# Patient Record
Sex: Female | Born: 1935 | Race: White | Hispanic: No | Smoking: Never smoker
Health system: Southern US, Community
[De-identification: ages and names within clinical notes are randomized; demographics above are authoritative.]

## PROBLEM LIST (undated history)

## (undated) DIAGNOSIS — M40209 Unspecified kyphosis, site unspecified: Secondary | ICD-10-CM

## (undated) DIAGNOSIS — E039 Hypothyroidism, unspecified: Secondary | ICD-10-CM

## (undated) DIAGNOSIS — F0390 Unspecified dementia without behavioral disturbance: Secondary | ICD-10-CM

## (undated) DIAGNOSIS — M419 Scoliosis, unspecified: Secondary | ICD-10-CM

## (undated) DIAGNOSIS — E785 Hyperlipidemia, unspecified: Secondary | ICD-10-CM

## (undated) DIAGNOSIS — I10 Essential (primary) hypertension: Secondary | ICD-10-CM

---
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
## 2018-08-11 ENCOUNTER — Encounter (HOSPITAL_COMMUNITY): Payer: Self-pay | Admitting: Emergency Medicine

## 2018-08-11 ENCOUNTER — Emergency Department (HOSPITAL_COMMUNITY)
Admission: EM | Admit: 2018-08-11 | Discharge: 2018-08-11 | Disposition: A | Discharge status: Home / Self Care | Payer: Medicare Other | Attending: Emergency Medicine | Admitting: Emergency Medicine

## 2018-08-11 ENCOUNTER — Emergency Department (HOSPITAL_COMMUNITY): Payer: Medicare Other

## 2018-08-11 ENCOUNTER — Other Ambulatory Visit: Payer: Self-pay

## 2018-08-11 DIAGNOSIS — Y92192 Bathroom in other specified residential institution as the place of occurrence of the external cause: Secondary | ICD-10-CM | POA: Diagnosis not present

## 2018-08-11 DIAGNOSIS — Y999 Unspecified external cause status: Secondary | ICD-10-CM | POA: Insufficient documentation

## 2018-08-11 DIAGNOSIS — W01198A Fall on same level from slipping, tripping and stumbling with subsequent striking against other object, initial encounter: Secondary | ICD-10-CM | POA: Diagnosis not present

## 2018-08-11 DIAGNOSIS — F0390 Unspecified dementia without behavioral disturbance: Secondary | ICD-10-CM | POA: Diagnosis not present

## 2018-08-11 DIAGNOSIS — Y939 Activity, unspecified: Secondary | ICD-10-CM | POA: Insufficient documentation

## 2018-08-11 DIAGNOSIS — W010XXA Fall on same level from slipping, tripping and stumbling without subsequent striking against object, initial encounter: Secondary | ICD-10-CM

## 2018-08-11 DIAGNOSIS — S0990XA Unspecified injury of head, initial encounter: Secondary | ICD-10-CM | POA: Diagnosis present

## 2018-08-11 DIAGNOSIS — S2231XA Fracture of one rib, right side, initial encounter for closed fracture: Secondary | ICD-10-CM | POA: Insufficient documentation

## 2018-08-11 LAB — CBC WITH DIFFERENTIAL/PLATELET
Abs Immature Granulocytes: 0.03 10*3/uL (ref 0.00–0.07)
Basophils Absolute: 0.1 10*3/uL (ref 0.0–0.1)
Basophils Relative: 1 %
Eosinophils Absolute: 0 10*3/uL (ref 0.0–0.5)
Eosinophils Relative: 0 %
HCT: 39.8 % (ref 36.0–46.0)
Hemoglobin: 13.4 g/dL (ref 12.0–15.0)
Immature Granulocytes: 0 %
Lymphocytes Relative: 11 %
Lymphs Abs: 1.1 10*3/uL (ref 0.7–4.0)
MCH: 30.4 pg (ref 26.0–34.0)
MCHC: 33.7 g/dL (ref 30.0–36.0)
MCV: 90.2 fL (ref 80.0–100.0)
Monocytes Absolute: 0.8 10*3/uL (ref 0.1–1.0)
Monocytes Relative: 8 %
Neutro Abs: 8.2 10*3/uL — ABNORMAL HIGH (ref 1.7–7.7)
Neutrophils Relative %: 80 %
Platelets: 311 10*3/uL (ref 150–400)
RBC: 4.41 MIL/uL (ref 3.87–5.11)
RDW: 13.2 % (ref 11.5–15.5)
WBC: 10.1 10*3/uL (ref 4.0–10.5)
nRBC: 0 % (ref 0.0–0.2)

## 2018-08-11 LAB — COMPREHENSIVE METABOLIC PANEL
ALT: 15 U/L (ref 0–44)
AST: 24 U/L (ref 15–41)
Albumin: 3.6 g/dL (ref 3.5–5.0)
Alkaline Phosphatase: 58 U/L (ref 38–126)
Anion gap: 8 (ref 5–15)
BUN: 12 mg/dL (ref 8–23)
CO2: 25 mmol/L (ref 22–32)
Calcium: 9.3 mg/dL (ref 8.9–10.3)
Chloride: 98 mmol/L (ref 98–111)
Creatinine, Ser: 0.73 mg/dL (ref 0.44–1.00)
GFR calc Af Amer: >60 mL/min (ref >60)
GFR calc non Af Amer: >60 mL/min (ref >60)
Glucose, Bld: 117 mg/dL — ABNORMAL HIGH (ref 70–99)
Potassium: 4.3 mmol/L (ref 3.5–5.1)
Sodium: 131 mmol/L — ABNORMAL LOW (ref 135–145)
Total Bilirubin: 1.2 mg/dL (ref 0.3–1.2)
Total Protein: 6.8 g/dL (ref 6.5–8.1)

## 2018-08-11 LAB — URINALYSIS, ROUTINE W REFLEX MICROSCOPIC
Bilirubin Urine: NEGATIVE
Glucose, UA: NEGATIVE mg/dL
Ketones, ur: 20 mg/dL — AB
Leukocytes,Ua: NEGATIVE
Nitrite: NEGATIVE
Protein, ur: NEGATIVE mg/dL
Specific Gravity, Urine: 1.014 (ref 1.005–1.030)
pH: 6 (ref 5.0–8.0)

## 2018-08-11 IMAGING — CT CT HEAD WITHOUT CONTRAST
4 series · 16 of 47 positions shown, 18 images · non-contrast
Comparison: None.

CLINICAL DATA: 88-year-old female status post unwitnessed fall.
Right rib fracture. Struck right side of head.

EXAM:
CT HEAD WITHOUT CONTRAST
TECHNIQUE: Contiguous axial images were obtained from the base of the skull
through the vertex without intravenous contrast.

[Series 3: head without · axial · non-contrast · 0.42mm/px · z∈[-129,-9]mm · 7 of 33 slices shown, 9 images]
[im 5/33  brain]
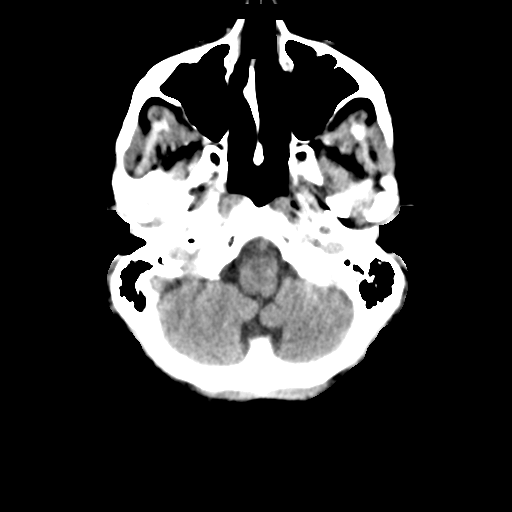
[im 5/33  bone]
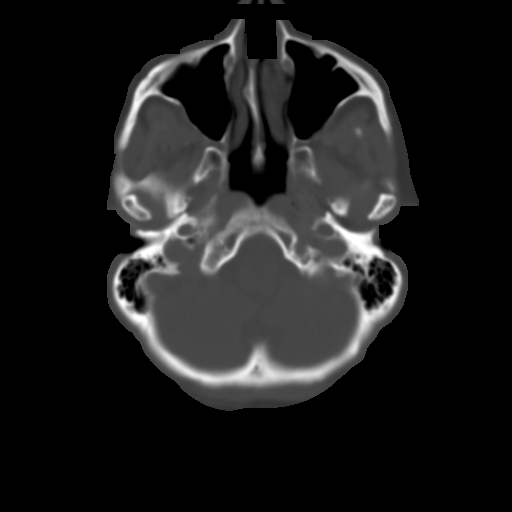
[im 9/33  brain]
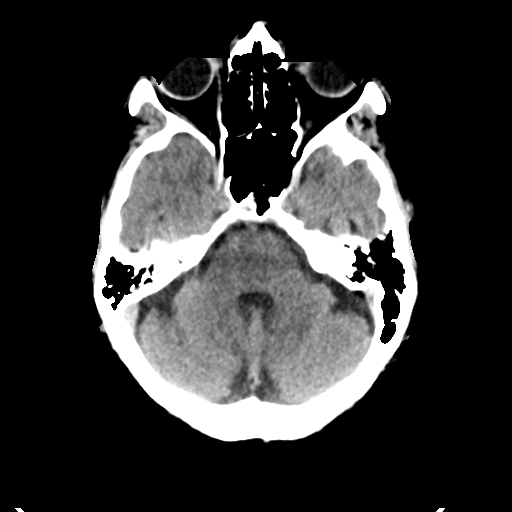
[im 13/33  brain]
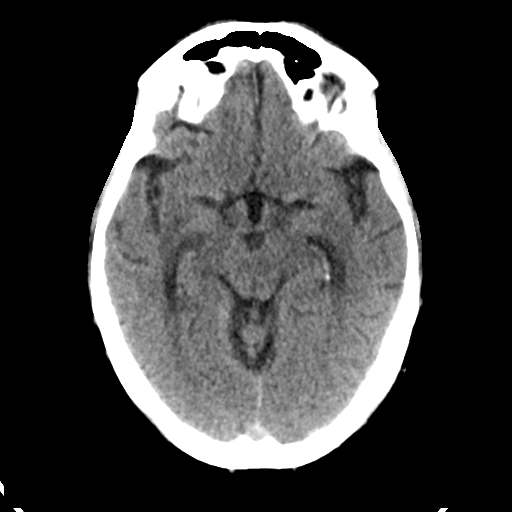
[im 17/33  brain]
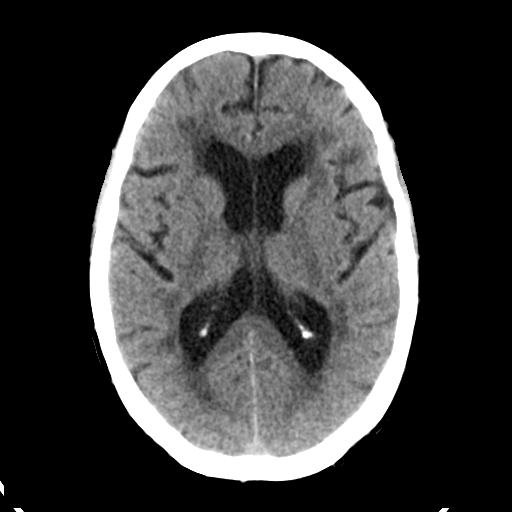
[im 21/33  brain]
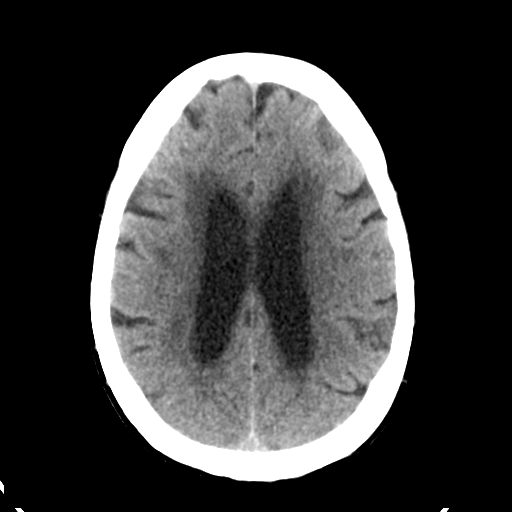
[im 21/33  bone]
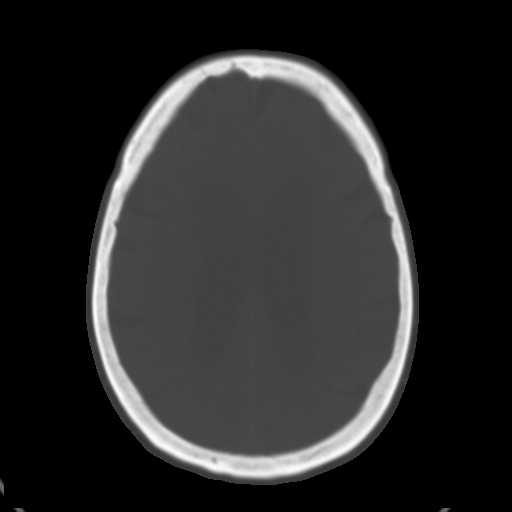
[im 25/33  brain]
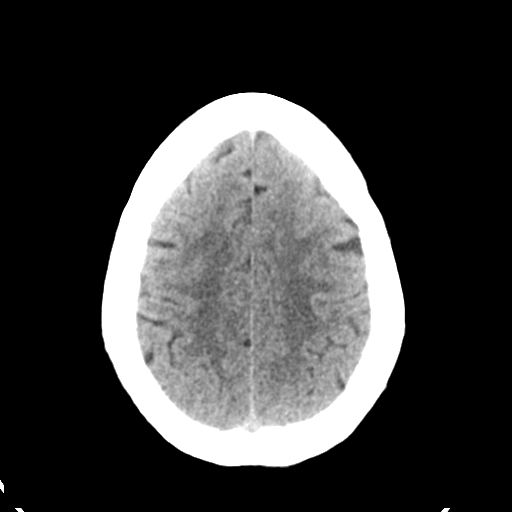
[im 29/33  brain]
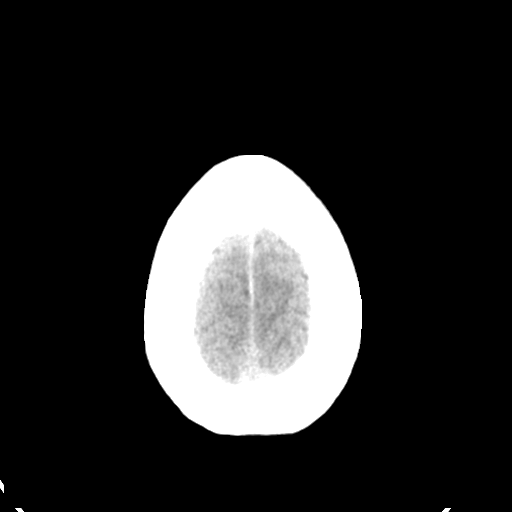

[Series 4: head bone · axial · 0.42mm/px · z∈[-133,-101]mm · 3 of 83 slices shown]
[im 9/83  bone]
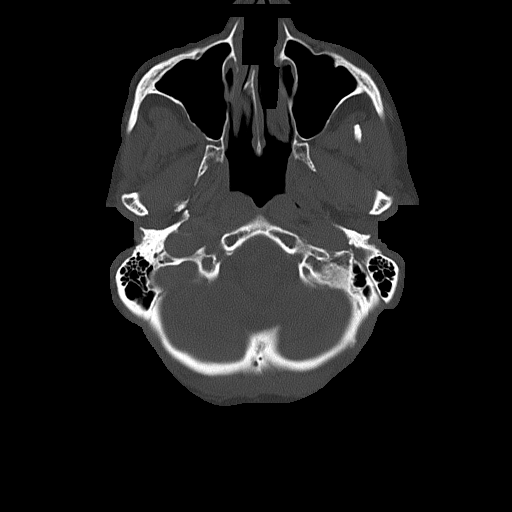
[im 17/83  bone]
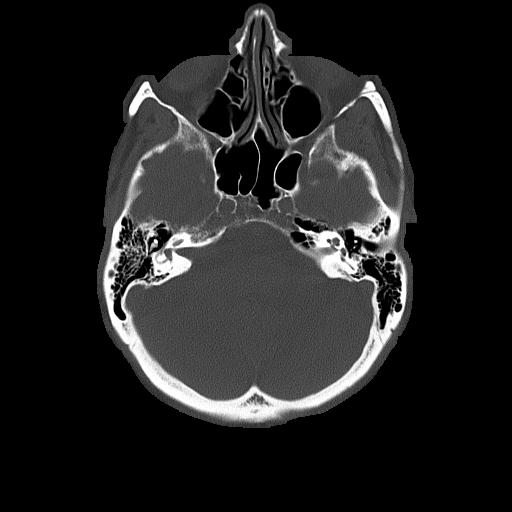
[im 25/83  bone]
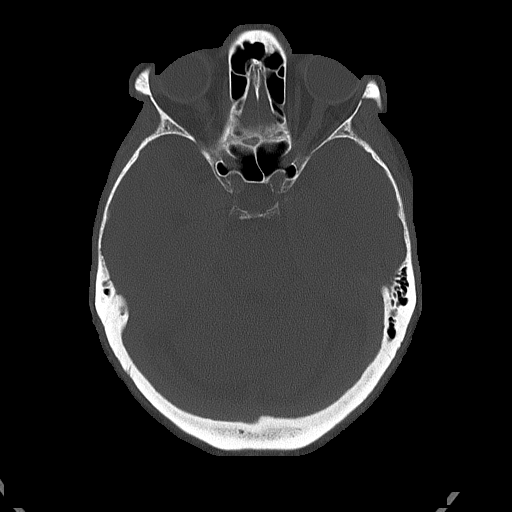

[Series 5: head without cor · coronal · non-contrast · 0.30mm/px · 3 of 66 slices shown]
[im 24/66  brain]
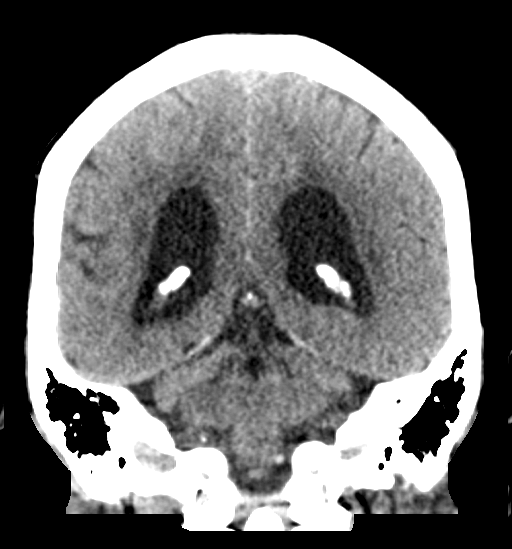
[im 30/66  brain]
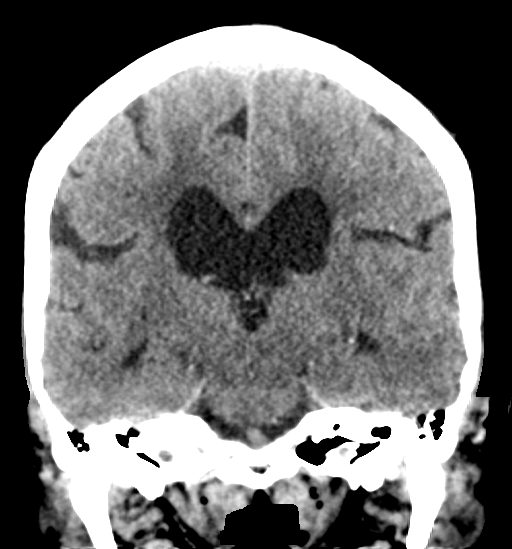
[im 36/66  brain]
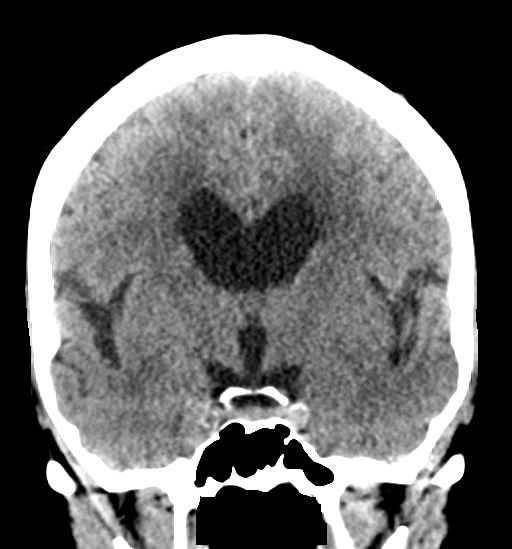

[Series 6: head without sag · sagittal · non-contrast · 0.32mm/px · 3 of 52 slices shown]
[im 18/52  brain]
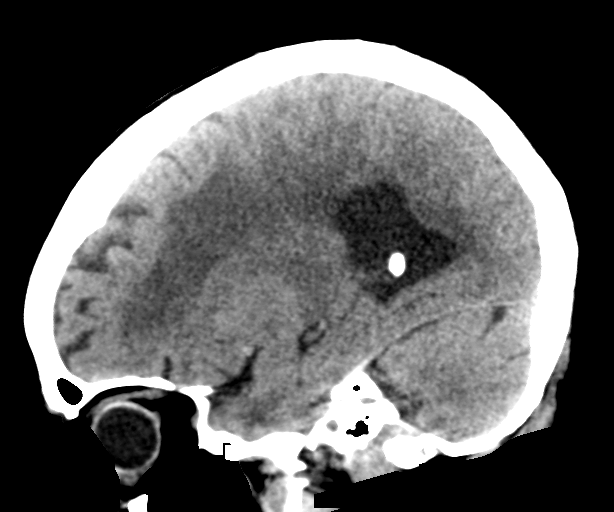
[im 26/52  brain]
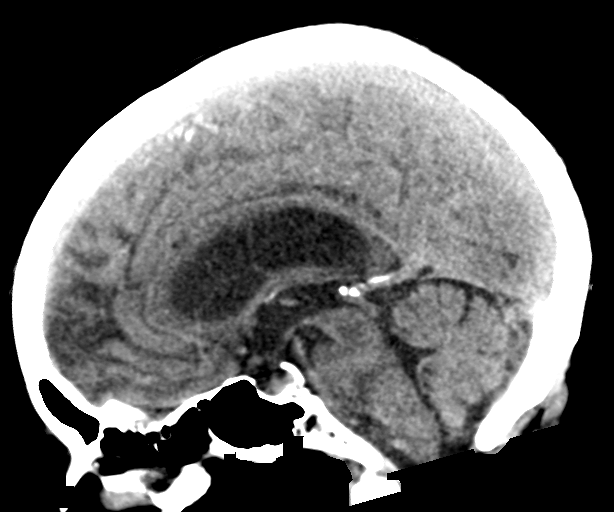
[im 35/52  brain]
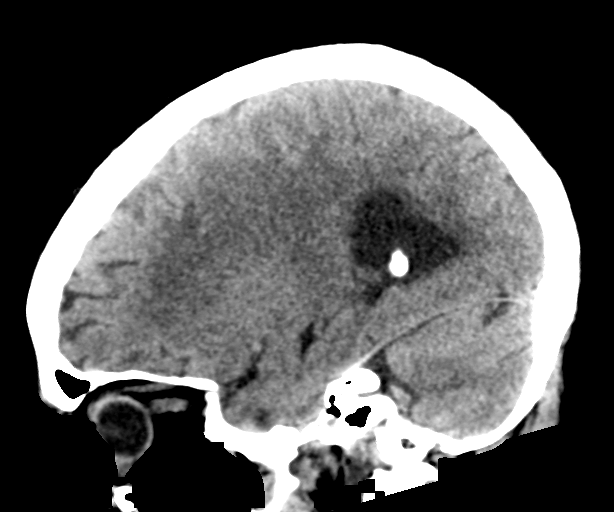

[16 of 47 positions shown; findings below may reference images not displayed]

FINDINGS: Brain: Cerebral volume appears relatively normal for age with
probable ex vacuo ventricular prominence. There is Patchy and
confluent bilateral cerebral white matter hypodensity with deep
white matter capsule involvement.

No midline shift, ventriculomegaly, mass effect, evidence of mass
lesion, intracranial hemorrhage or evidence of cortically based
acute infarction. No cortical encephalomalacia identified.

Vascular: Calcified atherosclerosis at the skull base. No suspicious
intracranial vascular hyperdensity.

Skull: No osseous abnormality identified.

Sinuses/Orbits: Visualized paranasal sinuses and mastoids are stable
and well pneumatized.

Other: No scalp hematoma identified. Negative orbits.
IMPRESSION: 1. No acute traumatic injury identified.
2. No acute intracranial abnormality. Advanced cerebral white matter
disease, most commonly small vessel related.

## 2018-08-11 IMAGING — CR RIGHT RIBS AND CHEST - 3+ VIEW
3 series · 3 of 3 positions shown · non-contrast
Comparison: None.

CLINICAL DATA: 88-year-old female status post fall with right lower
rib pain.

EXAM:
RIGHT RIBS AND CHEST - 3+ VIEW

[chest ap]
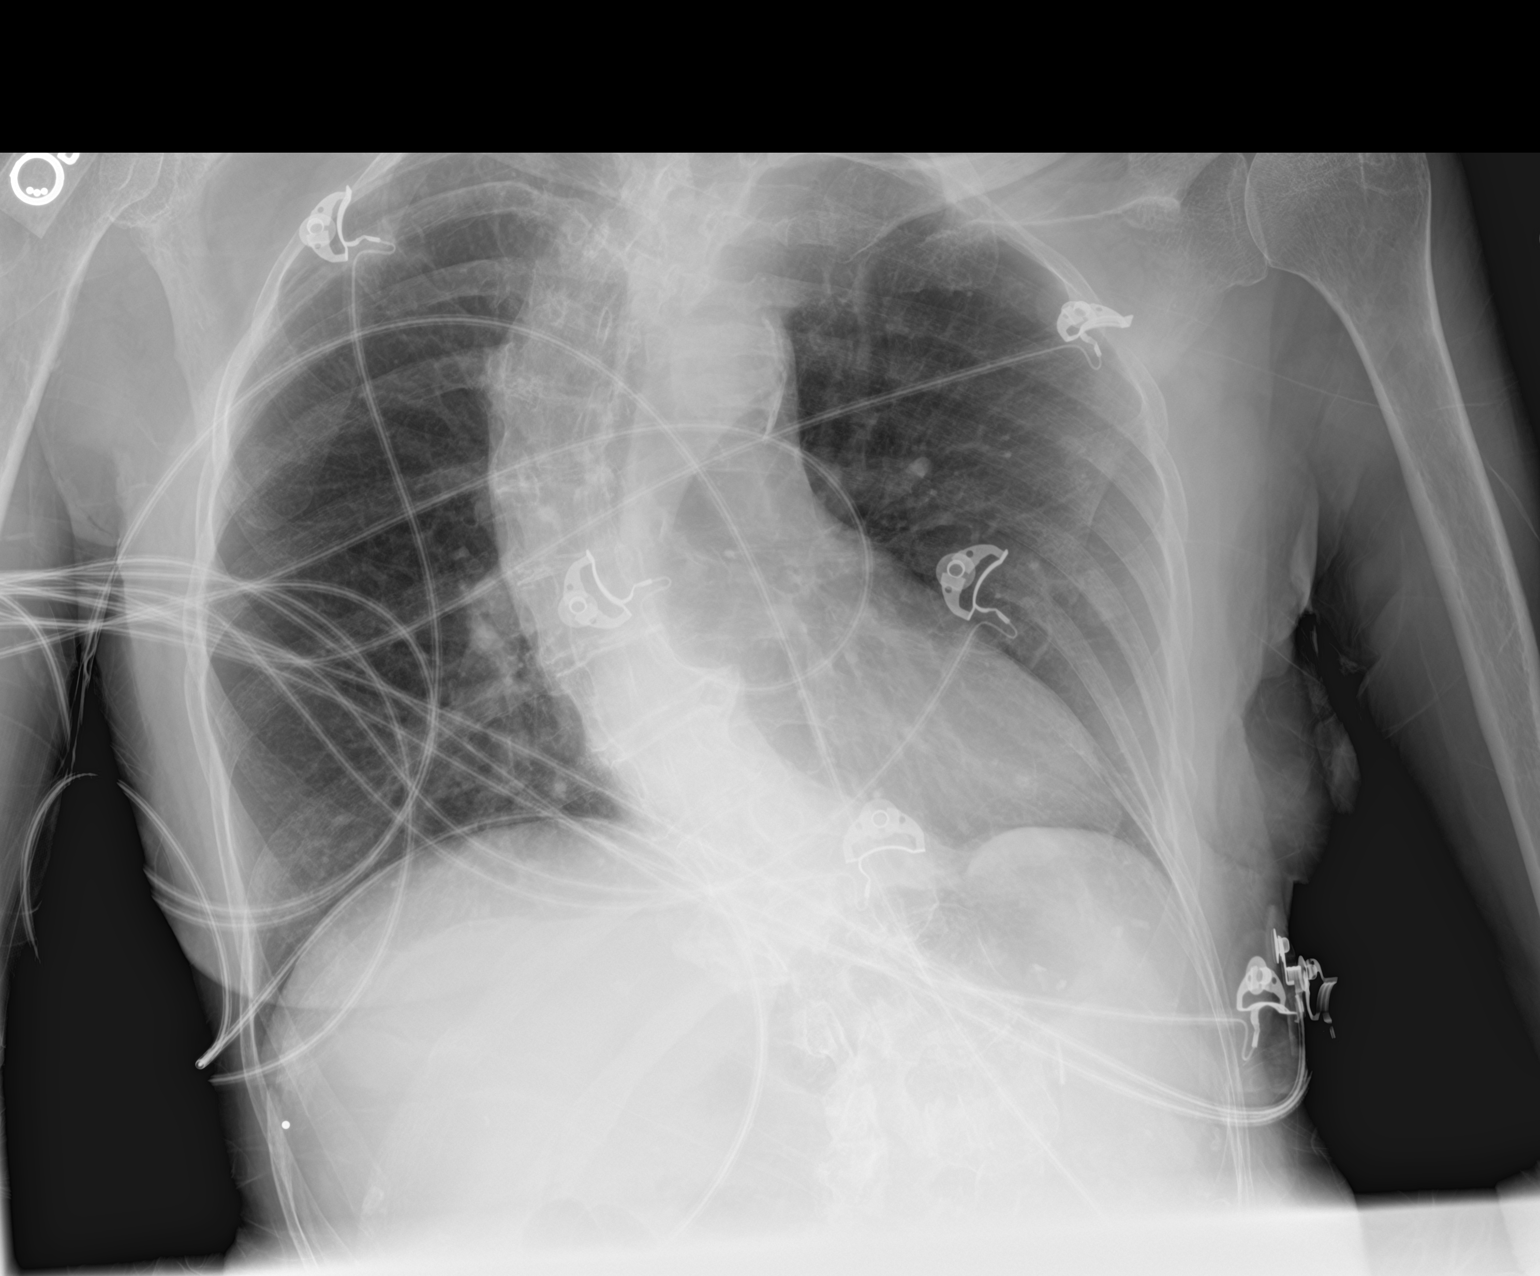

[rib ap]
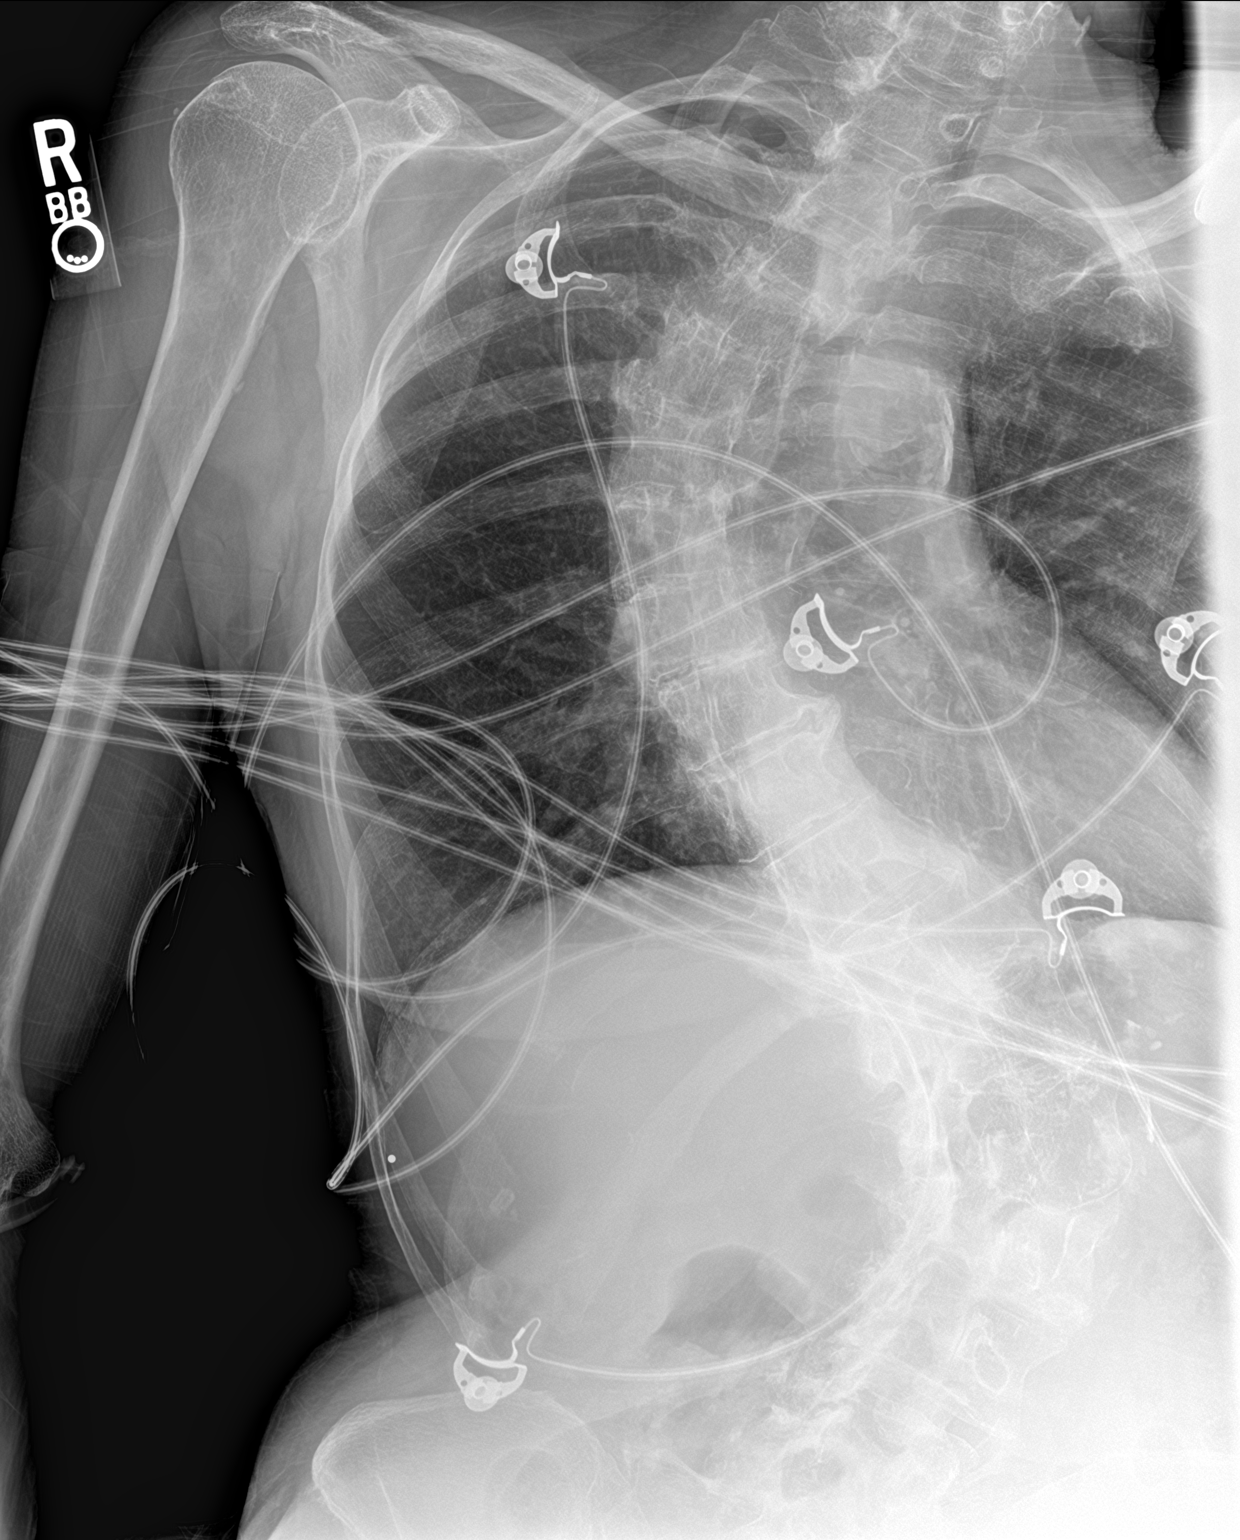

[rib ap obl]
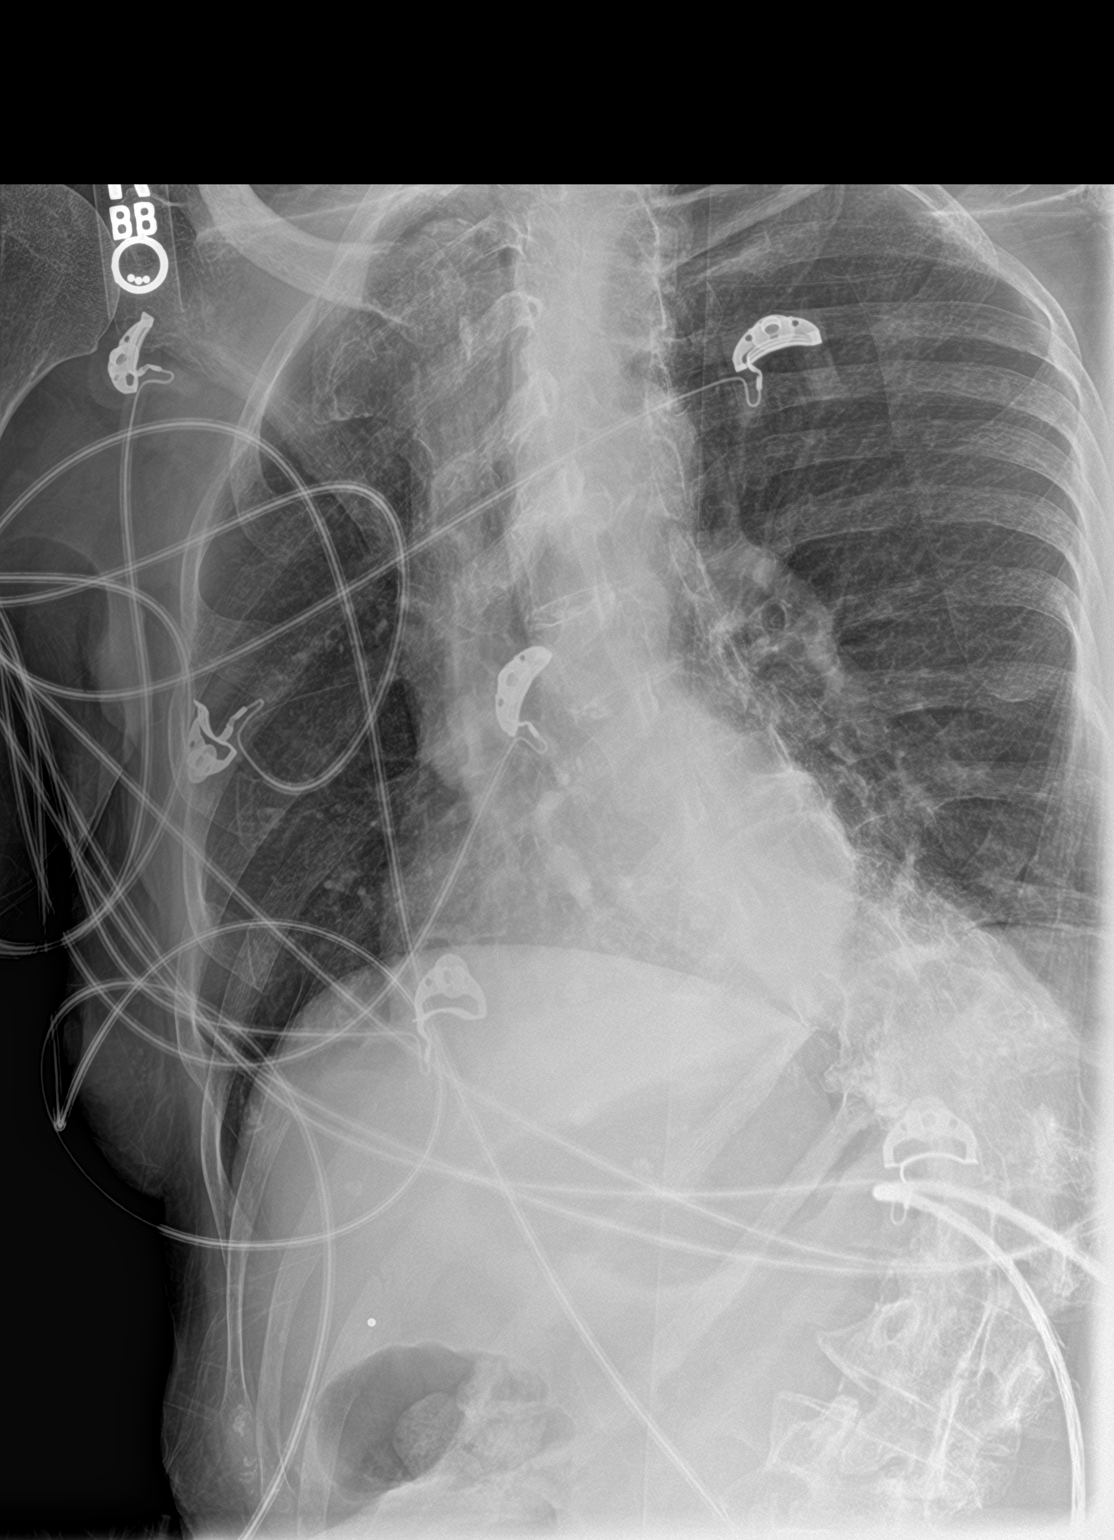

[3 of 3 positions shown; findings below may reference images not displayed]

FINDINGS: Seated upright AP view of the chest at [6R] hours. Moderate to
severe thoracolumbar scoliosis, dextroconvex in the thoracic spine.
Calcified aortic atherosclerosis. Other mediastinal contours are
within normal limits. Visualized tracheal air column is within
normal limits. No pneumothorax or pleural effusion, both lungs
appear clear.

Negative visible bowel gas pattern.

Two oblique views of the right ribs. There is a mildly displaced
right lateral 10th rib fracture which corresponds to the marked area
of concern. No 9th or 11th rib fracture. Other right ribs appear
intact.

Chronic appearing left lateral lower rib fractures. Scoliosis.
IMPRESSION: 1. Acute mildly displaced right lateral 10th rib fracture.
2. No acute cardiopulmonary abnormality.

## 2018-08-11 MED ORDER — ACETAMINOPHEN 325 MG PO TABS
650.0000 mg | ORAL_TABLET | Freq: Once | ORAL | Status: AC
  Administered 2018-08-11: 22:00:00 650 mg via ORAL
  Filled 2018-08-11: qty 2

## 2018-08-11 NOTE — ED Provider Notes (Signed)
[facility] EMERGENCY DEPARTMENT Provider Note   CSN: [id number] Arrival date & time: 08/11/18  2035    History   Chief Complaint Chief Complaint  Patient presents with   Fall    HPI Isabel Zamora is a 85 y.o. female.     Patient s/p fall at [facility] today. Patient with hx dementia, unsure what caused her to fall, fell in bathroom, hit head, ?momentary loc. Was able to get self up and back to bed. Also c/o contusion to right chest wall, pain to area, moderate, constant, worse w palpation/movement. Denies nv. No chest pain or discomfort. No sob. No abd pain. No neck or back pain. No radicular pain. No numbness/weakness. States recent normal appetite, normal po intake. Denies change in meds. No dysuria or gu c/o.   The history is provided by the patient.  Fall  Associated symptoms include chest pain and headaches. Pertinent negatives include no abdominal pain and no shortness of breath.    History reviewed. No pertinent past medical history.  There are no active problems to display for this patient.   History reviewed. No pertinent surgical history.   OB History   No obstetric history on file.      Home Medications    Prior to Admission medications   Not on File    Family History No family history on file.  Social History Social History   Tobacco Use   Smoking status: Not on file  Substance Use Topics   Alcohol use: Not on file   Drug use: Not on file     Allergies   Morphine and related and Penicillins   Review of Systems Review of Systems  Constitutional: Negative for fever.  HENT: Negative for sore throat.   Eyes: Negative for pain, redness and visual disturbance.  Respiratory: Negative for cough and shortness of breath.   Cardiovascular: Positive for chest pain. Negative for leg swelling.  Gastrointestinal: Negative for abdominal pain, diarrhea and vomiting.  Genitourinary: Negative for dysuria and flank pain.  Musculoskeletal:  Negative for back pain and neck pain.  Skin: Negative for rash.  Neurological: Positive for headaches. Negative for weakness and numbness.  Hematological: Does not bruise/bleed easily.  Psychiatric/Behavioral: Negative for agitation.     Physical Exam Updated Vital Signs BP (!) 143/68    Pulse 91    Temp 98.6 F (37 C) (Oral)    Resp 14    Ht 1.575 m ( )    Wt 48.5 kg    SpO2 93%    BMI 19.57 kg/m   Physical Exam Vitals signs and nursing note reviewed.  Constitutional:      Appearance: Normal appearance. She is well-developed.  HENT:     Head:     Comments: Contusion right scalp.     Nose: Nose normal.     Mouth/Throat:     Mouth: Mucous membranes are moist.  Eyes:     General: No scleral icterus.    Conjunctiva/sclera: Conjunctivae normal.     Pupils: Pupils are equal, round, and reactive to light.  Neck:     Musculoskeletal: Normal range of motion and neck supple. No neck rigidity or muscular tenderness.     Trachea: No tracheal deviation.  Cardiovascular:     Rate and Rhythm: Normal rate and regular rhythm.     Pulses: Normal pulses.     Heart sounds: Normal heart sounds. No murmur. No friction rub. No gallop.   Pulmonary:  Effort: Pulmonary effort is normal. No respiratory distress.     Breath sounds: Normal breath sounds.     Comments: Right chest wall tenderness. Normal chest wall movement. No crepitus.  Chest:     Chest wall: Tenderness present.  Abdominal:     General: Bowel sounds are normal. There is no distension.     Palpations: Abdomen is soft.     Tenderness: There is no abdominal tenderness. There is no guarding.  Genitourinary:    Comments: No cva tenderness.  Musculoskeletal:        General: No swelling or tenderness.     Comments: CTLS spine, non tender, aligned, no step off. Good rom bil ext without pain or focal bony tenderness.   Skin:    General: Skin is warm and dry.     Findings: No rash.  Neurological:     Mental Status: She is  alert.     Comments: Alert, speech normal/fluent. Motor intact bil, stre 5/5. sens grossly intact.   Psychiatric:        Mood and Affect: Mood normal.      ED Treatments / Results  Labs (all labs ordered are listed, but only abnormal results are displayed) Results for orders placed or performed during the hospital encounter of 08/11/18  CBC with Differential/Platelet  Result Value Ref Range   WBC 10.1 4.0 - 10.5 K/uL   RBC 4.41 3.87 - 5.11 MIL/uL   Hemoglobin 13.4 12.0 - 15.0 g/dL   HCT 54.6 50.3 - 54.6 %   MCV 90.2 80.0 - 100.0 fL   MCH 30.4 26.0 - 34.0 pg   MCHC 33.7 30.0 - 36.0 g/dL   RDW 56.8 12.7 - 51.7 %   Platelets 311 150 - 400 K/uL   nRBC 0.0 0.0 - 0.2 %   Neutrophils Relative % 80 %   Neutro Abs 8.2 (H) 1.7 - 7.7 K/uL   Lymphocytes Relative 11 %   Lymphs Abs 1.1 0.7 - 4.0 K/uL   Monocytes Relative 8 %   Monocytes Absolute 0.8 0.1 - 1.0 K/uL   Eosinophils Relative 0 %   Eosinophils Absolute 0.0 0.0 - 0.5 K/uL   Basophils Relative 1 %   Basophils Absolute 0.1 0.0 - 0.1 K/uL   Immature Granulocytes 0 %   Abs Immature Granulocytes 0.03 0.00 - 0.07 K/uL  Comprehensive metabolic panel  Result Value Ref Range   Sodium 131 (L) 135 - 145 mmol/L   Potassium 4.3 3.5 - 5.1 mmol/L   Chloride 98 98 - 111 mmol/L   CO2 25 22 - 32 mmol/L   Glucose, Bld 117 (H) 70 - 99 mg/dL   BUN 12 8 - 23 mg/dL   Creatinine, Ser 0.01 0.44 - 1.00 mg/dL   Calcium 9.3 8.9 - 74.9 mg/dL   Total Protein 6.8 6.5 - 8.1 g/dL   Albumin 3.6 3.5 - 5.0 g/dL   AST 24 15 - 41 U/L   ALT 15 0 - 44 U/L   Alkaline Phosphatase 58 38 - 126 U/L   Total Bilirubin 1.2 0.3 - 1.2 mg/dL   GFR calc non Af Amer >60 >60 mL/min   GFR calc Af Amer >60 >60 mL/min   Anion gap 8 5 - 15  Urinalysis, Routine w reflex microscopic  Result Value Ref Range   Color, Urine YELLOW YELLOW   APPearance HAZY (A) CLEAR   Specific Gravity, Urine 1.014 1.005 - 1.030   pH 6.0 5.0 - 8.0   Glucose,  UA NEGATIVE NEGATIVE mg/dL    Hgb urine dipstick MODERATE (A) NEGATIVE   Bilirubin Urine NEGATIVE NEGATIVE   Ketones, ur 20 (A) NEGATIVE mg/dL   Protein, ur NEGATIVE NEGATIVE mg/dL   Nitrite NEGATIVE NEGATIVE   Leukocytes,Ua NEGATIVE NEGATIVE   RBC / HPF 6-10 0 - 5 RBC/hpf   WBC, UA 0-5 0 - 5 WBC/hpf   Bacteria, UA RARE (A) NONE SEEN   Squamous Epithelial / LPF 11-20 0 - 5   Dg Ribs Unilateral W/chest Right  Result Date: 08/11/2018 CLINICAL DATA:  85 year old female status post fall with right lower rib pain. EXAM: RIGHT RIBS AND CHEST - 3+ VIEW COMPARISON:  None. FINDINGS: Seated upright AP view of the chest at 2131 hours. Moderate to severe thoracolumbar scoliosis, dextroconvex in the thoracic spine. Calcified aortic atherosclerosis. Other mediastinal contours are within normal limits. Visualized tracheal air column is within normal limits. No pneumothorax or pleural effusion, both lungs appear clear. Negative visible bowel gas pattern. Two oblique views of the right ribs. There is a mildly displaced right lateral 10th rib fracture which corresponds to the marked area of concern. No 9th or 11th rib fracture. Other right ribs appear intact. Chronic appearing left lateral lower rib fractures. Scoliosis. IMPRESSION: 1. Acute mildly displaced right lateral 10th rib fracture. 2. No acute cardiopulmonary abnormality. Electronically Signed   By: Odessa FlemingH  Hall M.D.   On: 08/11/2018 21:46   Ct Head Wo Contrast  Result Date: 08/11/2018 CLINICAL DATA:  85 year old female status post unwitnessed fall. Right rib fracture. Struck right side of head. EXAM: CT HEAD WITHOUT CONTRAST TECHNIQUE: Contiguous axial images were obtained from the base of the skull through the vertex without intravenous contrast. COMPARISON:  None. FINDINGS: Brain: Cerebral volume appears relatively normal for age with probable ex vacuo ventricular prominence. There is Patchy and confluent bilateral cerebral white matter hypodensity with deep white matter capsule  involvement. No midline shift, ventriculomegaly, mass effect, evidence of mass lesion, intracranial hemorrhage or evidence of cortically based acute infarction. No cortical encephalomalacia identified. Vascular: Calcified atherosclerosis at the skull base. No suspicious intracranial vascular hyperdensity. Skull: No osseous abnormality identified. Sinuses/Orbits: Visualized paranasal sinuses and mastoids are stable and well pneumatized. Other: No scalp hematoma identified. Negative orbits. IMPRESSION: 1. No acute traumatic injury identified. 2. No acute intracranial abnormality. Advanced cerebral white matter disease, most commonly small vessel related. Electronically Signed   By: Odessa FlemingH  Hall M.D.   On: 08/11/2018 21:54    EKG None  Radiology Dg Ribs Unilateral W/chest Right  Result Date: 08/11/2018 CLINICAL DATA:  85 year old female status post fall with right lower rib pain. EXAM: RIGHT RIBS AND CHEST - 3+ VIEW COMPARISON:  None. FINDINGS: Seated upright AP view of the chest at 2131 hours. Moderate to severe thoracolumbar scoliosis, dextroconvex in the thoracic spine. Calcified aortic atherosclerosis. Other mediastinal contours are within normal limits. Visualized tracheal air column is within normal limits. No pneumothorax or pleural effusion, both lungs appear clear. Negative visible bowel gas pattern. Two oblique views of the right ribs. There is a mildly displaced right lateral 10th rib fracture which corresponds to the marked area of concern. No 9th or 11th rib fracture. Other right ribs appear intact. Chronic appearing left lateral lower rib fractures. Scoliosis. IMPRESSION: 1. Acute mildly displaced right lateral 10th rib fracture. 2. No acute cardiopulmonary abnormality. Electronically Signed   By: Odessa FlemingH  Hall M.D.   On: 08/11/2018 21:46   Ct Head Wo Contrast  Result Date: 08/11/2018 CLINICAL DATA:  85 year old female status post unwitnessed fall. Right rib fracture. Struck right side of head. EXAM: CT  HEAD WITHOUT CONTRAST TECHNIQUE: Contiguous axial images were obtained from the base of the skull through the vertex without intravenous contrast. COMPARISON:  None. FINDINGS: Brain: Cerebral volume appears relatively normal for age with probable ex vacuo ventricular prominence. There is Patchy and confluent bilateral cerebral white matter hypodensity with deep white matter capsule involvement. No midline shift, ventriculomegaly, mass effect, evidence of mass lesion, intracranial hemorrhage or evidence of cortically based acute infarction. No cortical encephalomalacia identified. Vascular: Calcified atherosclerosis at the skull base. No suspicious intracranial vascular hyperdensity. Skull: No osseous abnormality identified. Sinuses/Orbits: Visualized paranasal sinuses and mastoids are stable and well pneumatized. Other: No scalp hematoma identified. Negative orbits. IMPRESSION: 1. No acute traumatic injury identified. 2. No acute intracranial abnormality. Advanced cerebral white matter disease, most commonly small vessel related. Electronically Signed   By: Odessa Fleming M.D.   On: 08/11/2018 21:54    Procedures Procedures (including critical care time)  Medications Ordered in ED Medications  acetaminophen (TYLENOL) tablet 650 mg (650 mg Oral Given 08/11/18 2207)     Initial Impression / Assessment and Plan / ED Course  I have reviewed the triage vital signs and the nursing notes.  Pertinent labs & imaging results that were available during my care of the patient were reviewed by me and considered in my medical decision making (see chart for details).  Iv ns. Labs.   Reviewed nursing notes and prior charts for additional history.   Chest xray reviewed by me - right rib fx, corresponding to pts symptoms.   Acetaminophen po. Po fluids.  Ambulate w assist/road test.   Recheck patient, comfortable. No sob. No faintness or dizziness.   Labs reviewed by me - na mildly low. ua w 0 wbc. abd soft nt.    Pt currently appears stable for d/c.   rec pcp f/u, fall precautions.   Return precautions provided.     Final Clinical Impressions(s) / ED Diagnoses   Final diagnoses:  None    ED Discharge Orders    None       Cathren Laine, MD 08/11/18 2211

## 2018-08-11 NOTE — ED Notes (Signed)
Incentive spirometer exercise performed. 1000-1250 mL x 14 times. Pt tolerated well.

## 2018-08-11 NOTE — ED Triage Notes (Signed)
Pt BIB EMS after an [facility]itnessed fall [facility]. Per daughter pt. Must have fallen sometime yesterday or early this morning. Pt. States falling on right side and hitting her head.  Per EMS given 25 mcg of Fentanyl.  Pt. Alert to self, place, and situation, pt. Disoriented to time. Per daughter this is patient baseline.

## 2018-08-11 NOTE — ED Notes (Signed)
Per EMS pt. Verbalized wanting to die. Discussed this with daughter at bedside. Daughter states pt. Has been depressed after husband died 1 year ago and depression has worsen since quarantine and recent move.

## 2018-08-11 NOTE — Discharge Instructions (Addendum)
It was our pleasure to provide your ER care today - we hope that you feel better.  Fall precautions - use great care to avoid falling. Consider use of walker for added stability.   Xrays show a right sided rib fracture. Use incentive spirometer, 10 full and complete breaths in every hour while awake.  Take acetaminophen as need for pain. You may also try ibuprofen as need for pain.   Follow up with primary care doctor in the next couple of weeks.  Return to ER if worse, new symptoms, fevers, trouble breathing, fainting or recurrent falls, other concern.

## 2018-08-11 NOTE — ED Notes (Signed)
Patient transported to X-ray and to be transported to CT after.

## 2018-08-11 NOTE — ED Notes (Signed)
Pt. Ambulated with assistance around room. Pt. Feeling Unsteady while walking around the room and states feeling dizzy.   Pt. Normally uses cane for assistance. According to daughter "mom is not normally this unsteady"

## 2018-08-11 NOTE — ED Notes (Signed)
Discharge instructions discussed with pt. And daughter at bedside. No questions at this time.   Waiting for PTAR

## 2020-01-30 ENCOUNTER — Inpatient Hospital Stay (HOSPITAL_COMMUNITY): Payer: Medicare Other

## 2020-01-30 ENCOUNTER — Inpatient Hospital Stay (HOSPITAL_COMMUNITY)
Admission: EM | Admit: 2020-01-30 | Discharge: 2020-02-02 | DRG: 689 | Disposition: A | Discharge status: Home Health | Payer: Medicare Other | Source: Skilled Nursing Facility | Attending: Family Medicine | Admitting: Family Medicine

## 2020-01-30 ENCOUNTER — Emergency Department (HOSPITAL_COMMUNITY): Payer: Medicare Other

## 2020-01-30 ENCOUNTER — Encounter (HOSPITAL_COMMUNITY): Payer: Self-pay | Admitting: Internal Medicine

## 2020-01-30 ENCOUNTER — Other Ambulatory Visit (HOSPITAL_COMMUNITY): Payer: Medicare Other

## 2020-01-30 DIAGNOSIS — I10 Essential (primary) hypertension: Secondary | ICD-10-CM | POA: Diagnosis present

## 2020-01-30 DIAGNOSIS — E785 Hyperlipidemia, unspecified: Secondary | ICD-10-CM | POA: Diagnosis present

## 2020-01-30 DIAGNOSIS — Z20822 Contact with and (suspected) exposure to covid-19: Secondary | ICD-10-CM | POA: Diagnosis present

## 2020-01-30 DIAGNOSIS — I639 Cerebral infarction, unspecified: Secondary | ICD-10-CM | POA: Diagnosis present

## 2020-01-30 DIAGNOSIS — E876 Hypokalemia: Secondary | ICD-10-CM | POA: Diagnosis not present

## 2020-01-30 DIAGNOSIS — F0390 Unspecified dementia without behavioral disturbance: Secondary | ICD-10-CM | POA: Diagnosis present

## 2020-01-30 DIAGNOSIS — G9341 Metabolic encephalopathy: Secondary | ICD-10-CM | POA: Diagnosis present

## 2020-01-30 DIAGNOSIS — Z66 Do not resuscitate: Secondary | ICD-10-CM | POA: Diagnosis present

## 2020-01-30 DIAGNOSIS — R4701 Aphasia: Secondary | ICD-10-CM

## 2020-01-30 DIAGNOSIS — E039 Hypothyroidism, unspecified: Secondary | ICD-10-CM | POA: Diagnosis present

## 2020-01-30 DIAGNOSIS — N179 Acute kidney failure, unspecified: Secondary | ICD-10-CM | POA: Diagnosis present

## 2020-01-30 DIAGNOSIS — N390 Urinary tract infection, site not specified: Secondary | ICD-10-CM | POA: Diagnosis not present

## 2020-01-30 DIAGNOSIS — Z880 Allergy status to penicillin: Secondary | ICD-10-CM

## 2020-01-30 DIAGNOSIS — F05 Delirium due to known physiological condition: Secondary | ICD-10-CM | POA: Diagnosis not present

## 2020-01-30 DIAGNOSIS — N3001 Acute cystitis with hematuria: Secondary | ICD-10-CM

## 2020-01-30 DIAGNOSIS — G14 Postpolio syndrome: Secondary | ICD-10-CM | POA: Diagnosis present

## 2020-01-30 HISTORY — DX: Hypothyroidism, unspecified: E03.9

## 2020-01-30 HISTORY — DX: Hyperlipidemia, unspecified: E78.5

## 2020-01-30 HISTORY — DX: Unspecified dementia, unspecified severity, without behavioral disturbance, psychotic disturbance, mood disturbance, and anxiety: F03.90

## 2020-01-30 HISTORY — DX: Essential (primary) hypertension: I10

## 2020-01-30 LAB — RAPID URINE DRUG SCREEN, HOSP PERFORMED
Amphetamines: NOT DETECTED
Barbiturates: NOT DETECTED
Benzodiazepines: NOT DETECTED
Cocaine: NOT DETECTED
Opiates: NOT DETECTED
Tetrahydrocannabinol: NOT DETECTED

## 2020-01-30 LAB — RESPIRATORY PANEL BY RT PCR (FLU A&B, COVID)
Influenza A by PCR: NEGATIVE
Influenza B by PCR: NEGATIVE
SARS Coronavirus 2 by RT PCR: NEGATIVE

## 2020-01-30 LAB — DIFFERENTIAL
Abs Immature Granulocytes: 0.09 10*3/uL — ABNORMAL HIGH (ref 0.00–0.07)
Basophils Absolute: 0 10*3/uL (ref 0.0–0.1)
Basophils Relative: 0 %
Eosinophils Absolute: 0 10*3/uL (ref 0.0–0.5)
Eosinophils Relative: 0 %
Immature Granulocytes: 1 %
Lymphocytes Relative: 5 %
Lymphs Abs: 1 10*3/uL (ref 0.7–4.0)
Monocytes Absolute: 1.2 10*3/uL — ABNORMAL HIGH (ref 0.1–1.0)
Monocytes Relative: 6 %
Neutro Abs: 17.1 10*3/uL — ABNORMAL HIGH (ref 1.7–7.7)
Neutrophils Relative %: 88 %

## 2020-01-30 LAB — URINALYSIS, ROUTINE W REFLEX MICROSCOPIC
Bilirubin Urine: NEGATIVE
Glucose, UA: 150 mg/dL — AB
Ketones, ur: 5 mg/dL — AB
Nitrite: POSITIVE — AB
Protein, ur: 30 mg/dL — AB
Specific Gravity, Urine: >1.046 — ABNORMAL HIGH (ref 1.005–1.030)
WBC, UA: >50 WBC/hpf — ABNORMAL HIGH (ref 0–5)
pH: 5 (ref 5.0–8.0)

## 2020-01-30 LAB — I-STAT CHEM 8, ED
BUN: 23 mg/dL (ref 8–23)
Calcium, Ion: 1.18 mmol/L (ref 1.15–1.40)
Chloride: 105 mmol/L (ref 98–111)
Creatinine, Ser: 1 mg/dL (ref 0.44–1.00)
Glucose, Bld: 109 mg/dL — ABNORMAL HIGH (ref 70–99)
HCT: 40 % (ref 36.0–46.0)
Hemoglobin: 13.6 g/dL (ref 12.0–15.0)
Potassium: 4.1 mmol/L (ref 3.5–5.1)
Sodium: 140 mmol/L (ref 135–145)
TCO2: 27 mmol/L (ref 22–32)

## 2020-01-30 LAB — COMPREHENSIVE METABOLIC PANEL
ALT: 21 U/L (ref 0–44)
AST: 38 U/L (ref 15–41)
Albumin: 3.8 g/dL (ref 3.5–5.0)
Alkaline Phosphatase: 82 U/L (ref 38–126)
Anion gap: 12 (ref 5–15)
BUN: 17 mg/dL (ref 8–23)
CO2: 24 mmol/L (ref 22–32)
Calcium: 9.6 mg/dL (ref 8.9–10.3)
Chloride: 104 mmol/L (ref 98–111)
Creatinine, Ser: 1.08 mg/dL — ABNORMAL HIGH (ref 0.44–1.00)
GFR, Estimated: 46 mL/min — ABNORMAL LOW (ref >60)
Glucose, Bld: 110 mg/dL — ABNORMAL HIGH (ref 70–99)
Potassium: 4 mmol/L (ref 3.5–5.1)
Sodium: 140 mmol/L (ref 135–145)
Total Bilirubin: 0.7 mg/dL (ref 0.3–1.2)
Total Protein: 7.2 g/dL (ref 6.5–8.1)

## 2020-01-30 LAB — CBC
HCT: 40.4 % (ref 36.0–46.0)
HCT: 37.6 % (ref 36.0–46.0)
Hemoglobin: 12.9 g/dL (ref 12.0–15.0)
Hemoglobin: 12.1 g/dL (ref 12.0–15.0)
MCH: 29.9 pg (ref 26.0–34.0)
MCH: 30.1 pg (ref 26.0–34.0)
MCHC: 31.9 g/dL (ref 30.0–36.0)
MCHC: 32.2 g/dL (ref 30.0–36.0)
MCV: 93.5 fL (ref 80.0–100.0)
MCV: 93.5 fL (ref 80.0–100.0)
Platelets: 307 10*3/uL (ref 150–400)
Platelets: 318 10*3/uL (ref 150–400)
RBC: 4.32 MIL/uL (ref 3.87–5.11)
RBC: 4.02 MIL/uL (ref 3.87–5.11)
RDW: 14.2 % (ref 11.5–15.5)
RDW: 14.4 % (ref 11.5–15.5)
WBC: 19.5 10*3/uL — ABNORMAL HIGH (ref 4.0–10.5)
WBC: 16.7 10*3/uL — ABNORMAL HIGH (ref 4.0–10.5)
nRBC: 0 % (ref 0.0–0.2)
nRBC: 0 % (ref 0.0–0.2)

## 2020-01-30 LAB — ETHANOL: Alcohol, Ethyl (B): <10 mg/dL

## 2020-01-30 LAB — PROTIME-INR
INR: 1 (ref 0.8–1.2)
Prothrombin Time: 13.2 s (ref 11.4–15.2)

## 2020-01-30 LAB — CBG MONITORING, ED: Glucose-Capillary: 100 mg/dL — ABNORMAL HIGH (ref 70–99)

## 2020-01-30 LAB — TSH: TSH: 0.599 u[IU]/mL (ref 0.350–4.500)

## 2020-01-30 LAB — CREATININE, SERUM
Creatinine, Ser: 0.82 mg/dL (ref 0.44–1.00)
GFR, Estimated: >60 mL/min (ref >60)

## 2020-01-30 LAB — LDL CHOLESTEROL, DIRECT: Direct LDL: 239.8 mg/dL — ABNORMAL HIGH (ref 0–99)

## 2020-01-30 LAB — LACTIC ACID, PLASMA: Lactic Acid, Venous: 1.6 mmol/L (ref 0.5–1.9)

## 2020-01-30 LAB — APTT: aPTT: 23 seconds — ABNORMAL LOW (ref 24–36)

## 2020-01-30 IMAGING — MR MR HEAD W/O CM
9 of 10 series · 37 of 48 positions shown · non-contrast
Comparison: CT code stroke imaging from the same day.

CLINICAL DATA: Neuro deficit, acute stroke suspected.

EXAM:
MRI HEAD WITHOUT CONTRAST
TECHNIQUE: Multiplanar, multiecho pulse sequences of the brain and surrounding
structures were obtained without intravenous contrast.

[Series 3: DWI · axial · 3.0mm · 1.09mm/px · z∈[-21,+132]mm · 9 of 104 slices shown (1 of 4)]
[im 1/104]
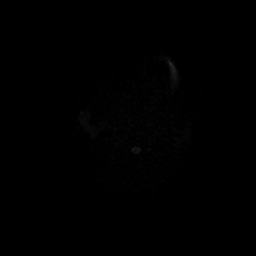
[im 13/104]
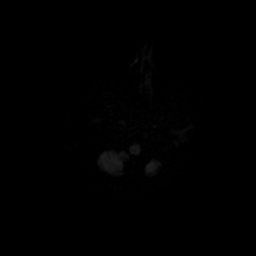
[im 26/104]
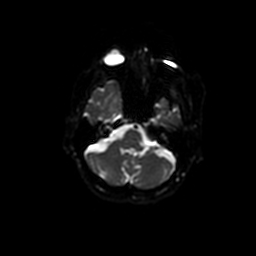
[im 39/104]
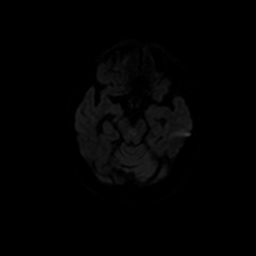
[im 52/104]
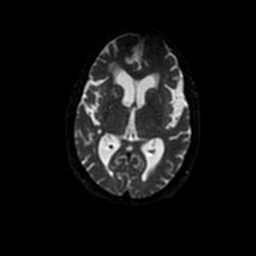
[im 65/104]
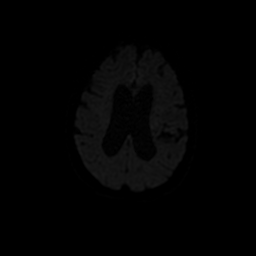
[im 78/104]
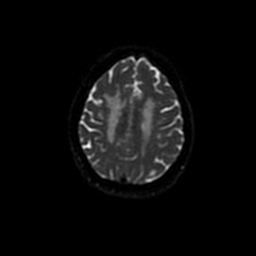
[im 91/104]
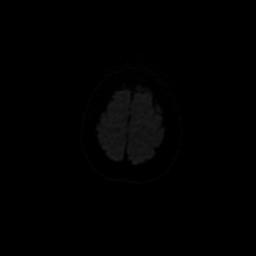
[im 104/104]
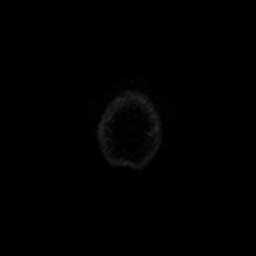

[Series 4: DWI · coronal · 5.0mm · 1.09mm/px · 8 of 80 slices shown (2 of 4)]
[im 1/80]
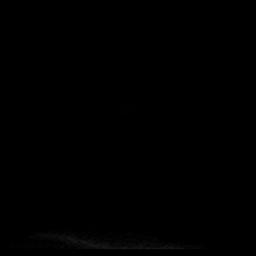
[im 12/80]
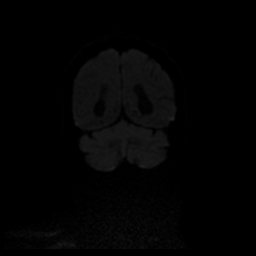
[im 23/80]
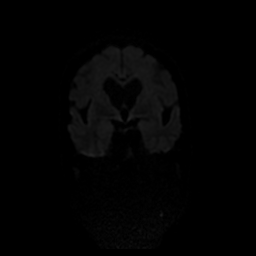
[im 34/80]
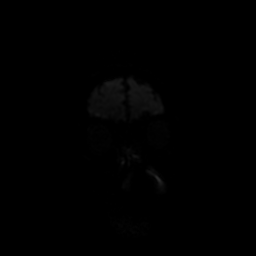
[im 46/80]
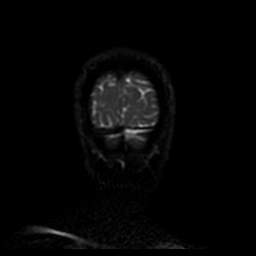
[im 57/80]
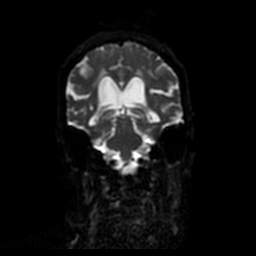
[im 68/80]
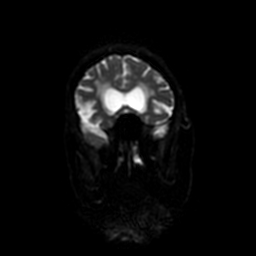
[im 80/80]
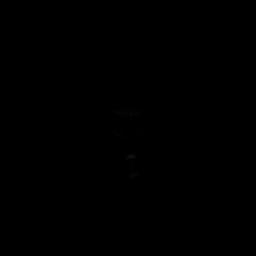

[Series 5: T1 · sagittal · 5.0mm · 0.47mm/px · 2 of 22 slices shown (1 of 2)]
[im 1/22]
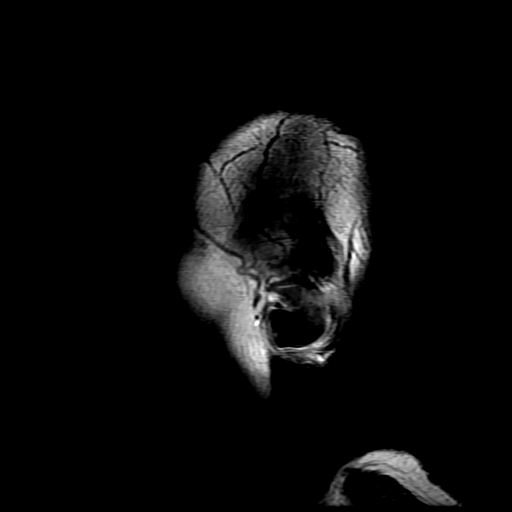
[im 22/22]
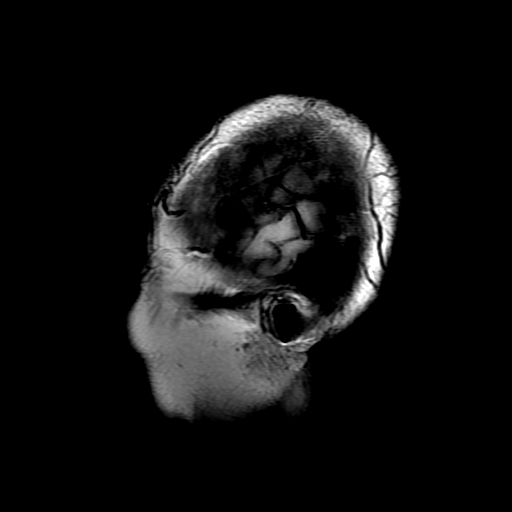

[Series 6: T2 · axial · 5.0mm · 0.43mm/px · z∈[-36,+113]mm · 3 of 26 slices shown (1 of 2)]
[im 1/26]
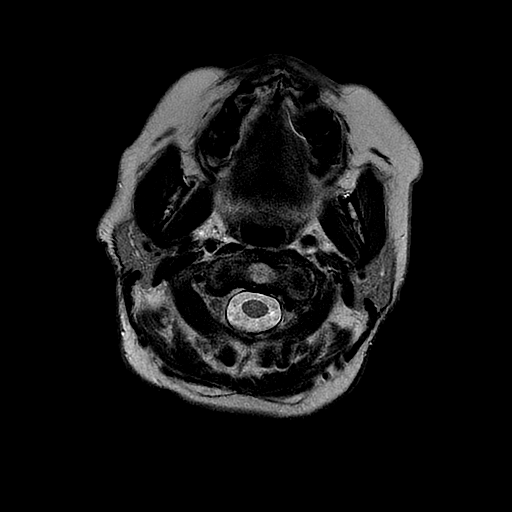
[im 13/26]
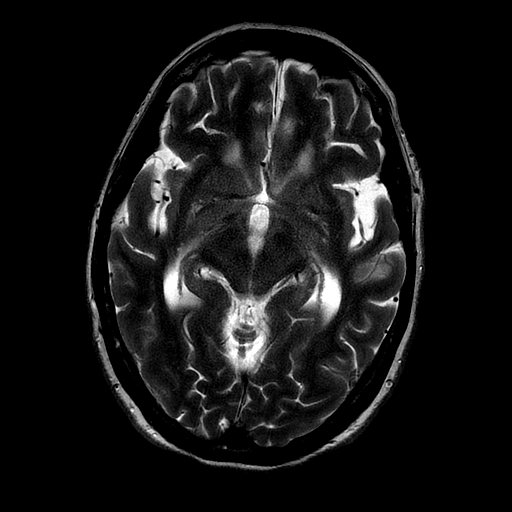
[im 26/26]
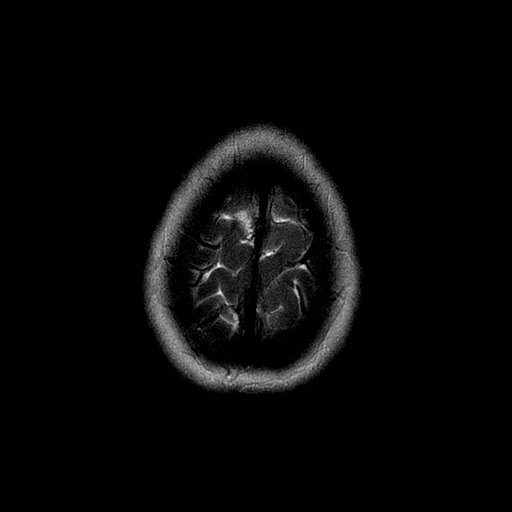

[Series 7: FLAIR · axial · 5.0mm · 0.43mm/px · z∈[-36,+113]mm · 3 of 26 slices shown]
[im 1/26]
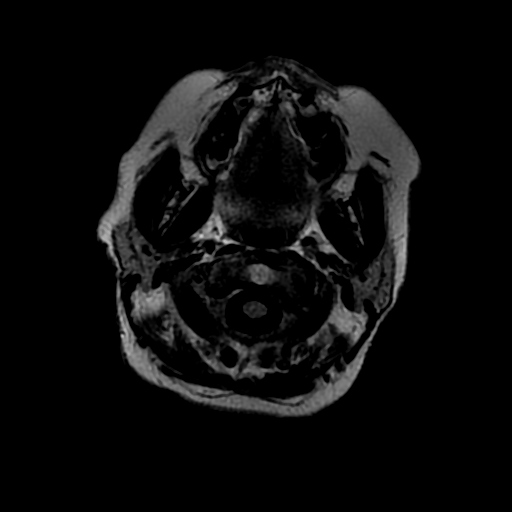
[im 13/26]
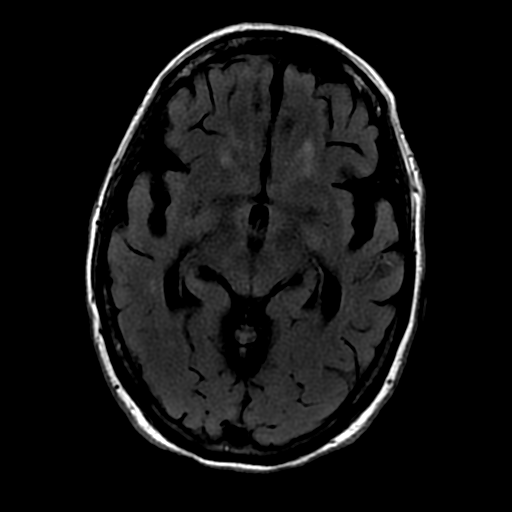
[im 26/26]
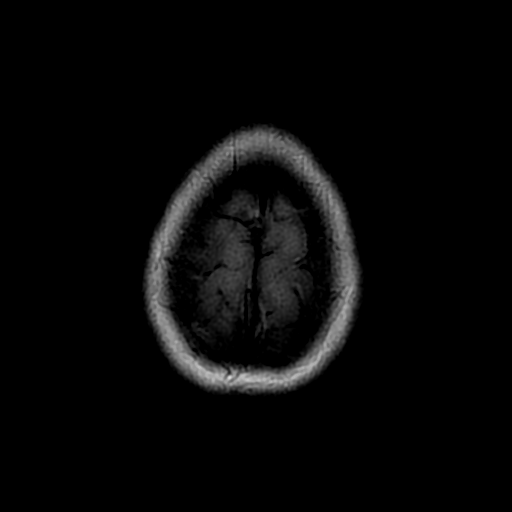

[Series 9: T1 · axial · 3.0mm · 0.43mm/px · 1 of 104 slices shown (2 of 2)]
[im 1/104]
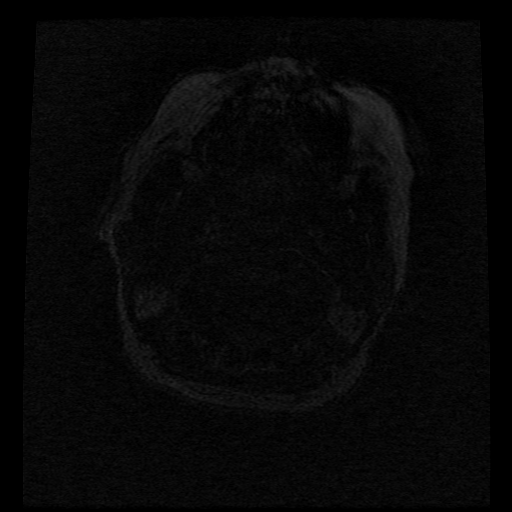

[Series 10: T2 · coronal · 5.0mm · 0.43mm/px · 2 of 24 slices shown (2 of 2)]
[im 1/24]
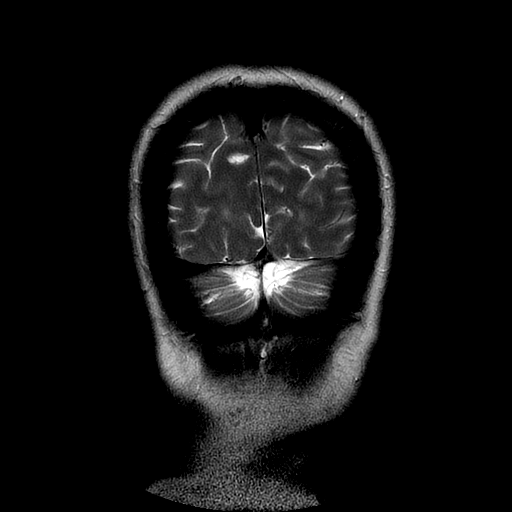
[im 24/24]
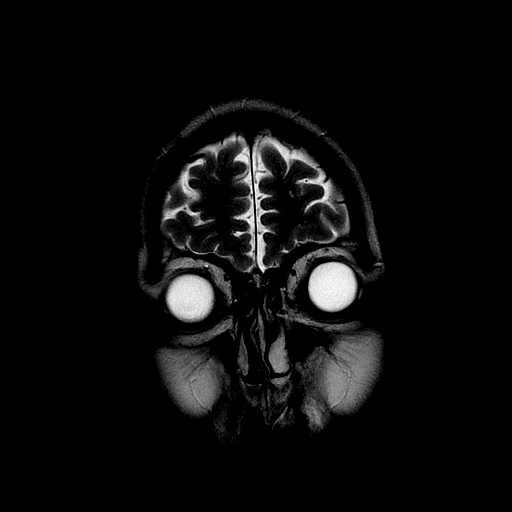

[Series 300: DWI · axial · 3.0mm · 1.09mm/px · z∈[-21,+132]mm · 5 of 52 slices shown (3 of 4)]
[im 1/52]
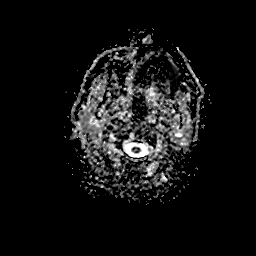
[im 13/52]
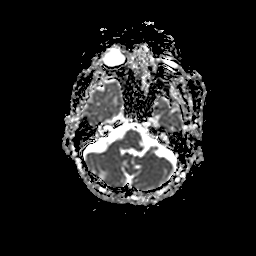
[im 26/52]
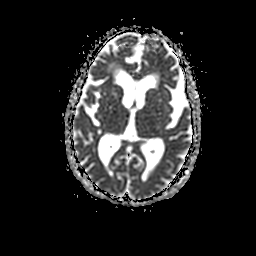
[im 39/52]
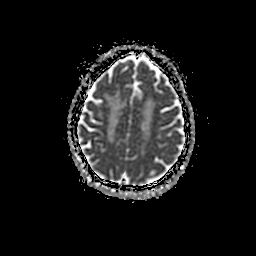
[im 52/52]
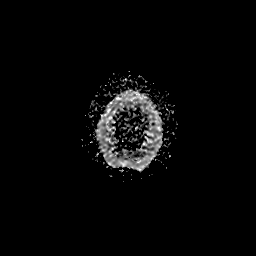

[Series 400: DWI · coronal · 5.0mm · 1.09mm/px · 4 of 39 slices shown (4 of 4)]
[im 1/39]
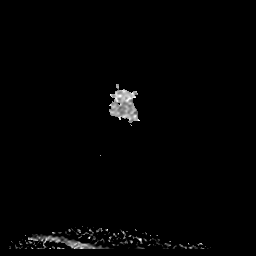
[im 13/39]
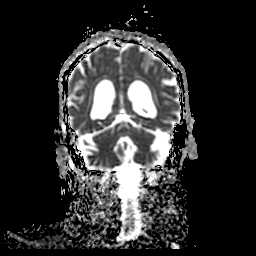
[im 26/39]
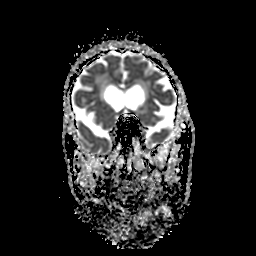
[im 39/39]
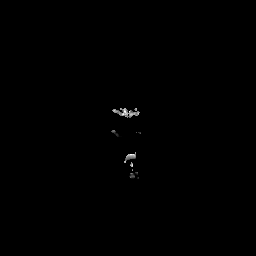

[37 of 48 positions shown; findings below may reference images not displayed]

FINDINGS: Brain: No acute infarction, hemorrhage, hydrocephalus, extra-axial
collection or mass lesion. Moderate patchy T2/FLAIR hyperintensity
within the white matter, compatible with chronic microvascular
ischemic disease. Generalized cerebral atrophy with ex vacuo
ventricular dilation.

Vascular: Major arterial flow voids are maintained at the skull
base. See same day CTA for further vascular evaluation.

Skull and upper cervical spine: Normal marrow signal.

Sinuses/Orbits: The sinuses are clear. No acute orbital abnormality.

Other: No mastoid effusions.
IMPRESSION: 1. No acute intracranial abnormality. Specifically, no acute
infarct.
2. Moderate chronic microvascular ischemic disease and generalized
cerebral atrophy.

## 2020-01-30 IMAGING — DX DG CHEST 1V PORT
1 series · 1 of 1 positions shown · non-contrast
Comparison: Chest x-ray dated [DATE].

CLINICAL DATA: Right-sided weakness and aphasia.

EXAM:
PORTABLE CHEST 1 VIEW

[chest]
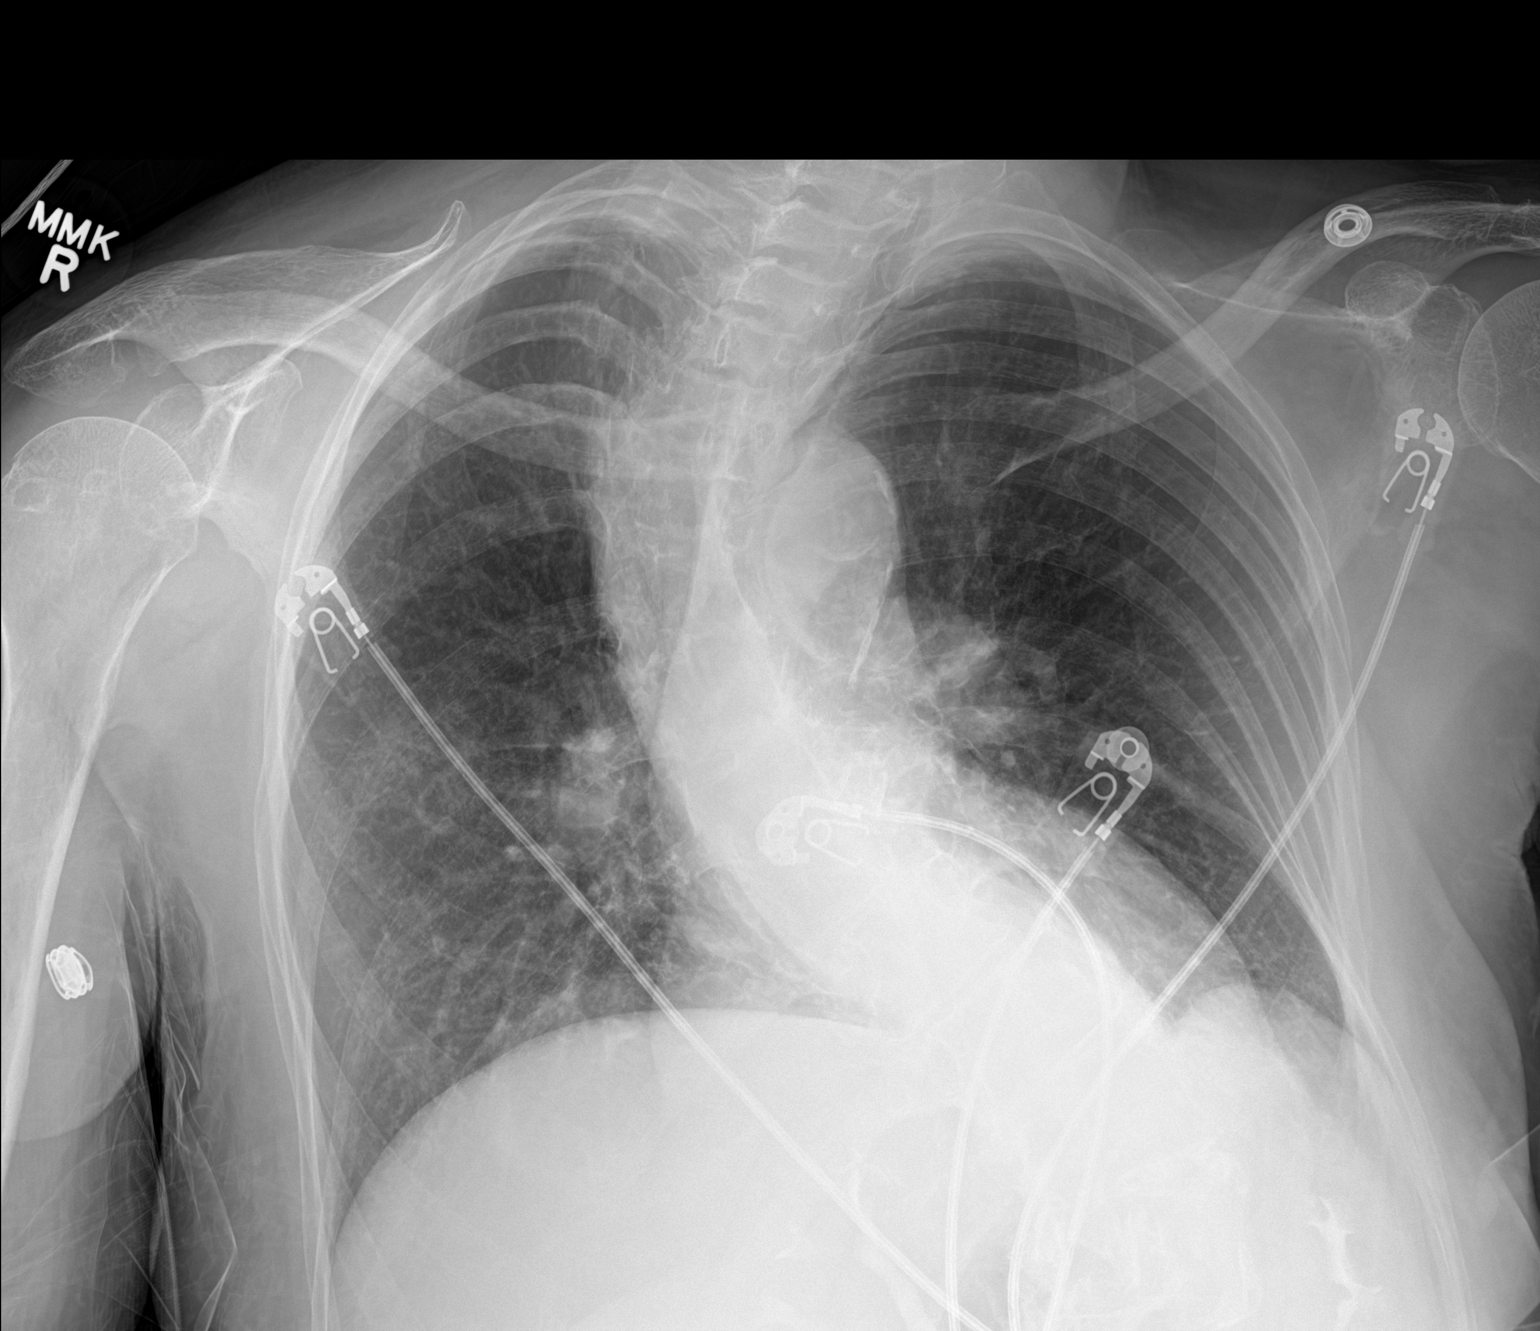

[1 of 1 positions shown; findings below may reference images not displayed]

FINDINGS: The heart size and mediastinal contours are within normal limits.
Normal pulmonary vascularity. No focal consolidation, pleural
effusion, or pneumothorax. No acute osseous abnormality. Unchanged
severe thoracolumbar scoliosis.
IMPRESSION: No active disease.

## 2020-01-30 IMAGING — CT CT HEAD CODE STROKE
4 series · 16 of 47 positions shown, 18 images · non-contrast
Comparison: Head CT [DATE].

CLINICAL DATA: Code stroke. Right-sided weakness, last known well
[PD].

EXAM:
CT HEAD WITHOUT CONTRAST
TECHNIQUE: Contiguous axial images were obtained from the base of the skull
through the vertex without intravenous contrast.

[Series 3: head wo · axial · 0.41mm/px · z∈[-111,+9]mm · 7 of 34 slices shown, 9 images]
[im 5/34  brain]
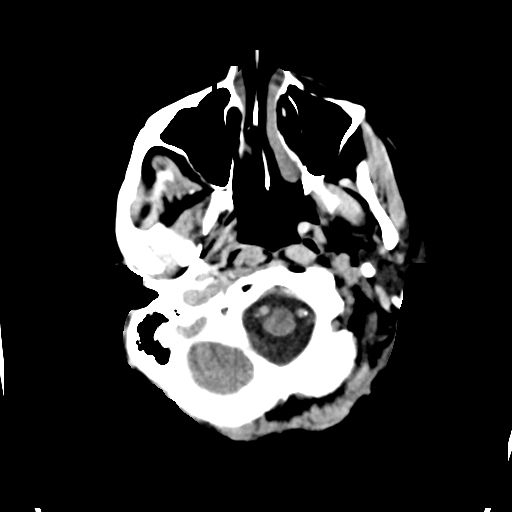
[im 5/34  bone]
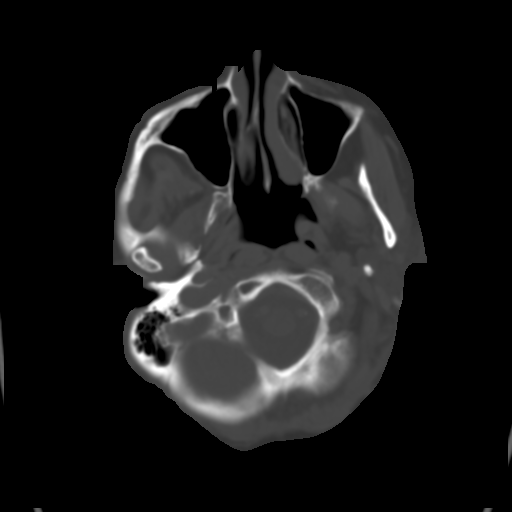
[im 9/34  brain]
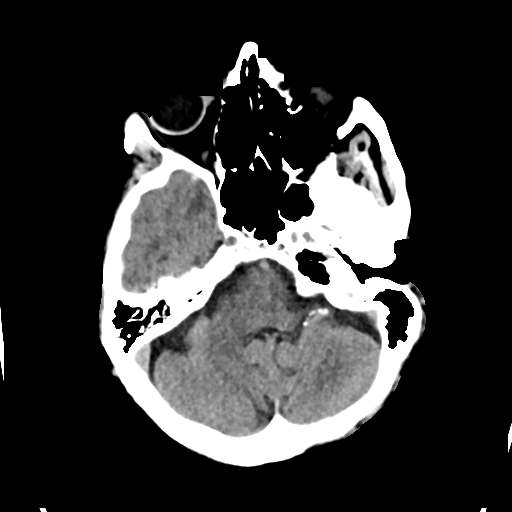
[im 13/34  brain]
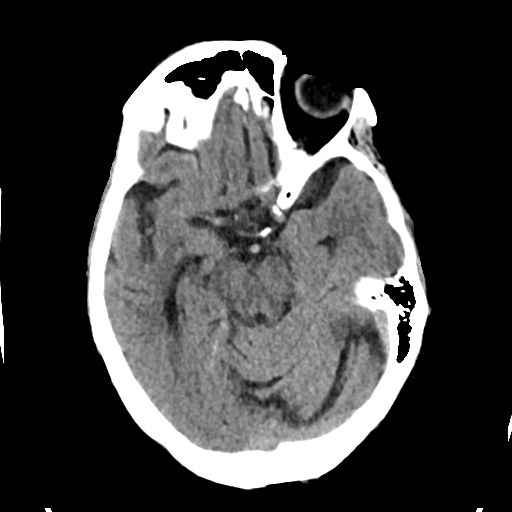
[im 17/34  brain]
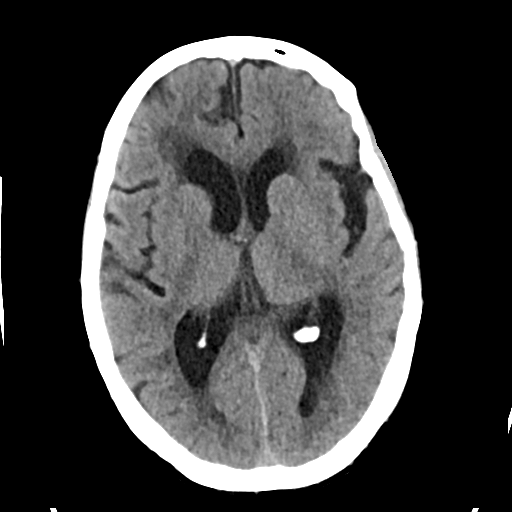
[im 21/34  brain]
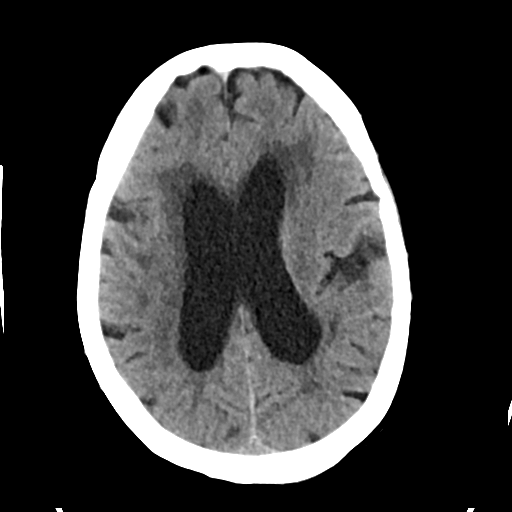
[im 21/34  bone]
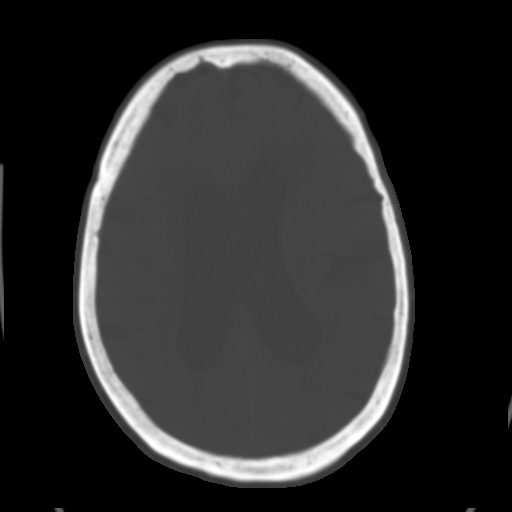
[im 25/34  brain]
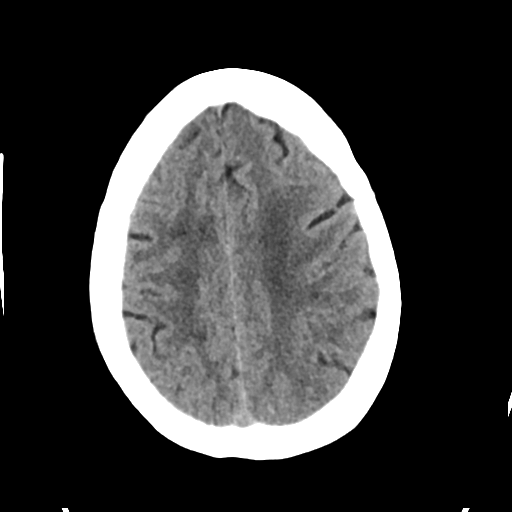
[im 29/34  brain]
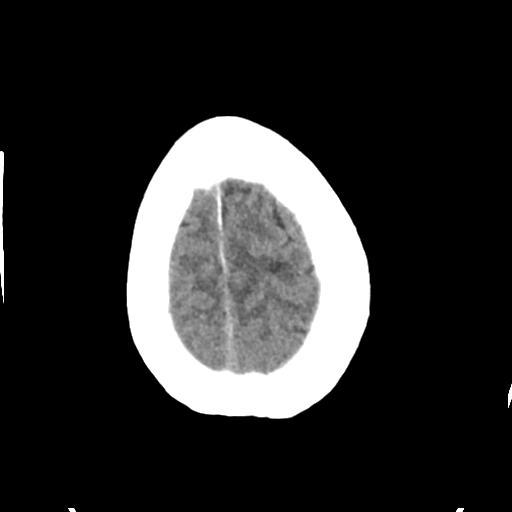

[Series 4: head bone · axial · 0.41mm/px · z∈[-115,-83]mm · 3 of 84 slices shown]
[im 9/84  bone]
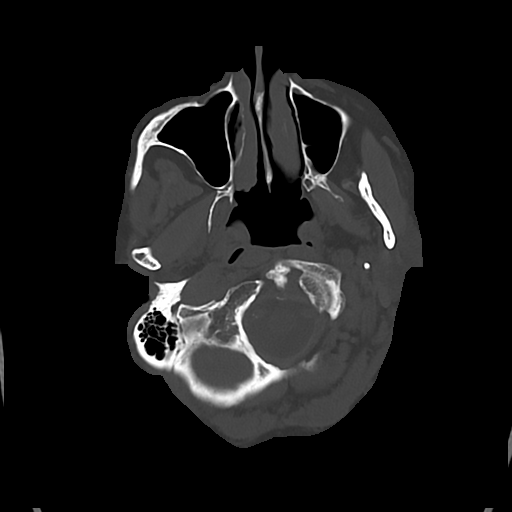
[im 17/84  bone]
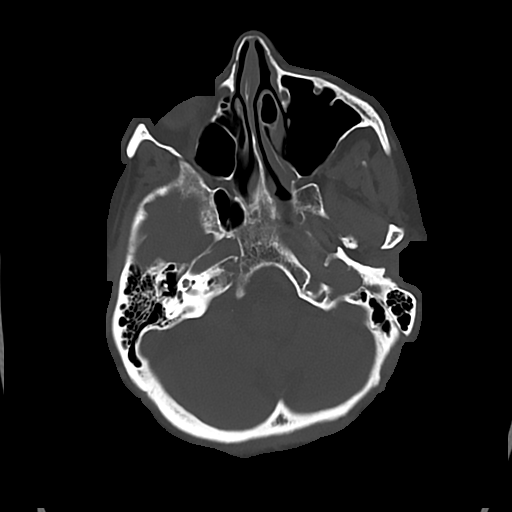
[im 25/84  bone]
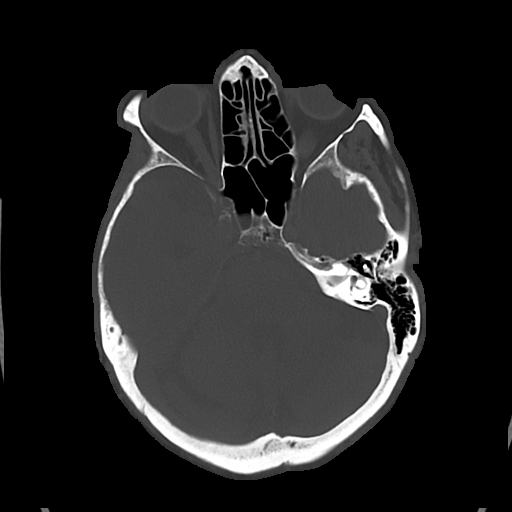

[Series 5: cor soft · coronal · 0.32mm/px · 3 of 69 slices shown]
[im 23/69  brain]
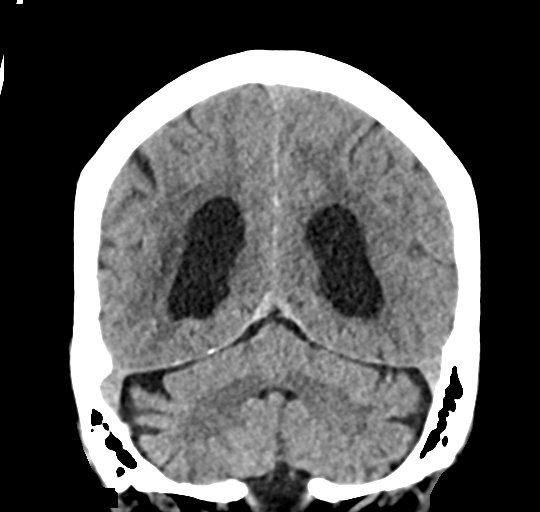
[im 31/69  brain]
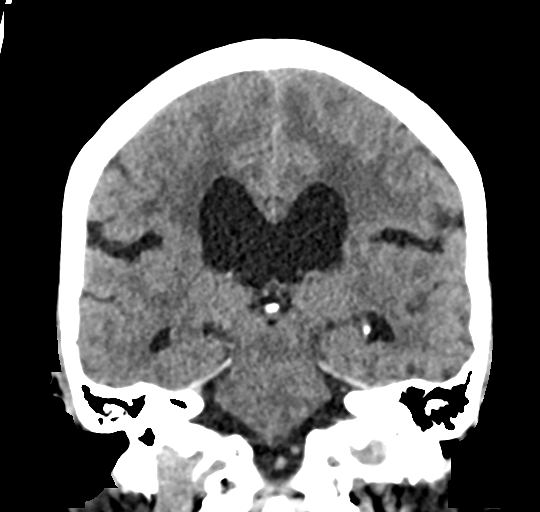
[im 38/69  brain]
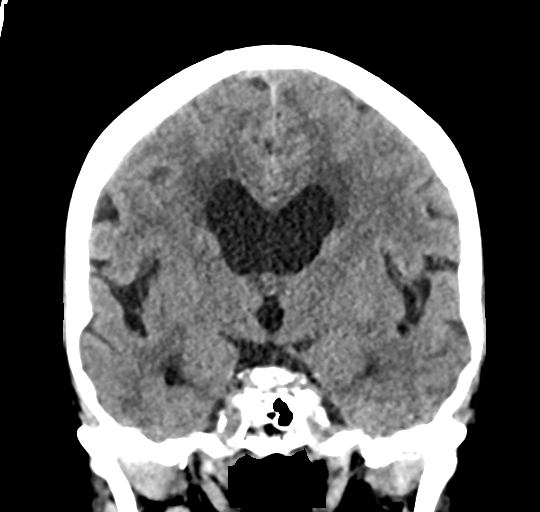

[Series 6: sag soft · sagittal · 0.32mm/px · 3 of 58 slices shown]
[im 23/58  brain]
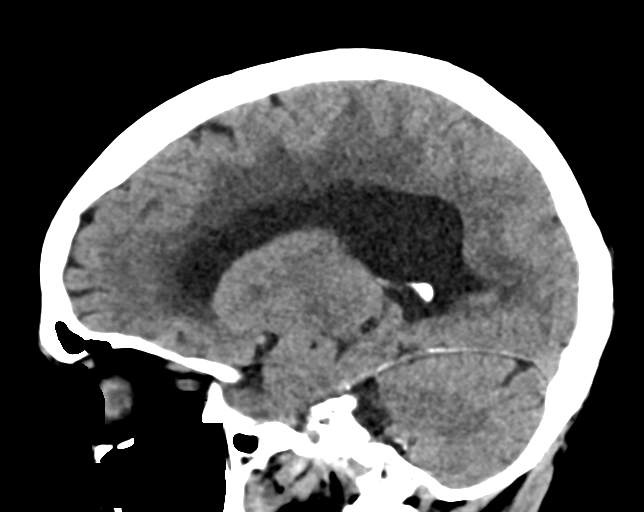
[im 29/58  brain]
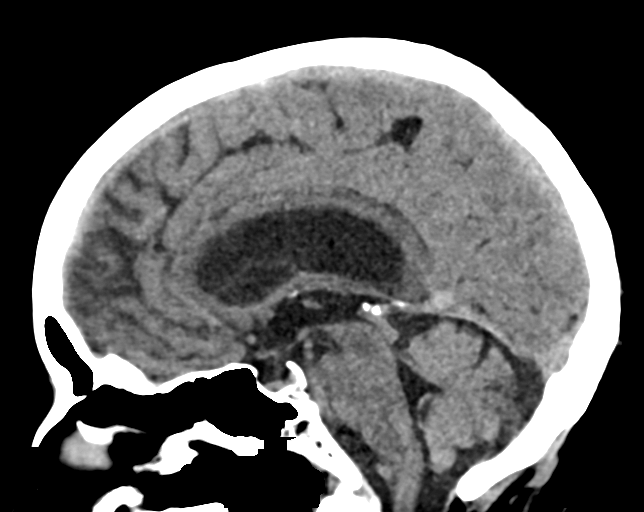
[im 36/58  brain]
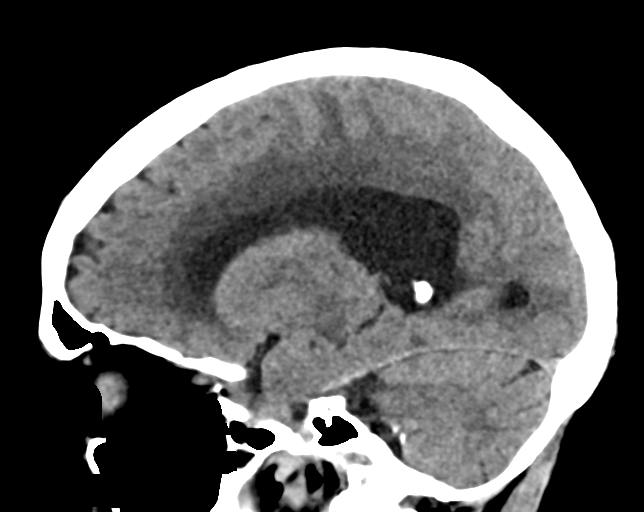

[16 of 47 positions shown; findings below may reference images not displayed]

FINDINGS: Brain:

Mild generalized cerebral atrophy.

There is a small lacunar infarct within the posterior limb of left
internal capsule which was not definitively present on the prior
examination of [DATE] and may be acute (series 3, image 18).

Stable background moderate ill-defined hypoattenuation within the
cerebral white matter which is nonspecific, but consistent with
chronic small vessel ischemic disease.

There is no acute intracranial hemorrhage.

No demarcated cortical infarct.

No extra-axial fluid collection.

No evidence of intracranial mass.

No midline shift.

Vascular: No hyperdense vessel.

Skull: Normal. Negative for fracture or focal lesion.

Sinuses/Orbits: Visualized orbits show no acute finding. No
significant paranasal sinus disease or mastoid effusion at the
imaged levels.

ASPECTS (Alberta Stroke Program Early CT Score)

- Ganglionic level infarction (caudate, lentiform nuclei, internal
capsule, insula, M1-M3 cortex): 6

- Supraganglionic infarction (M4-M6 cortex): 3

Total score (0-10 with 10 being normal): 9

These results were communicated to Dr. LEICHTENBERG At [DATE] LEICHTENBERG
[DATE]by text page via the [employer] messaging system.
IMPRESSION: Small lacunar infarct within the posterior limb of left internal
capsule which was not definitively present on the prior head CT of
[DATE] and which may be acute. ASPECTS is 9.

No acute intracranial hemorrhage or acute demarcated cortical
infarction.

Stable background mild generalized cerebral atrophy and moderate
chronic small vessel ischemic disease.

## 2020-01-30 IMAGING — CT CT ANGIO HEAD-NECK
3 of 7 series · 10 of 36 positions shown · IV contrast (APPLIED)
Comparison: Noncontrast head CT performed immediately prior.
COMPARISON: Noncontrast head CT performed immediately prior.

Addendum:
CLINICAL DATA: Stroke/TIA, assess extracranial arteries.
Stroke/TIA, assess intracranial arteries. Additional history
provided: Suspected left MCA syndrome.

EXAM:
CT ANGIOGRAPHY HEAD AND NECK
TECHNIQUE: Multidetector CT imaging of the head and neck was performed using
the standard protocol during bolus administration of intravenous
contrast. Multiplanar CT image reconstructions and MIPs were
obtained to evaluate the vascular anatomy. Carotid stenosis
measurements (when applicable) are obtained utilizing NASCET
criteria, using the distal internal carotid diameter as the
denominator.
CONTRAST:  Administered contrast not known at this time.

[Series 5: cta neck/head · axial · 0.47mm/px · z∈[-188,-76]mm · 2 of 169 slices shown]
[im 57/169  soft-tissue]
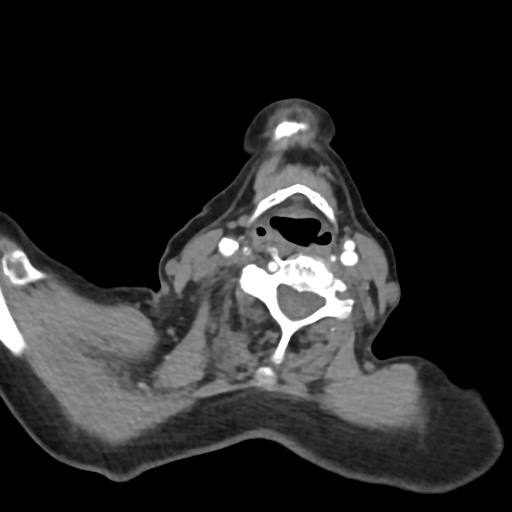
[im 113/169  soft-tissue]
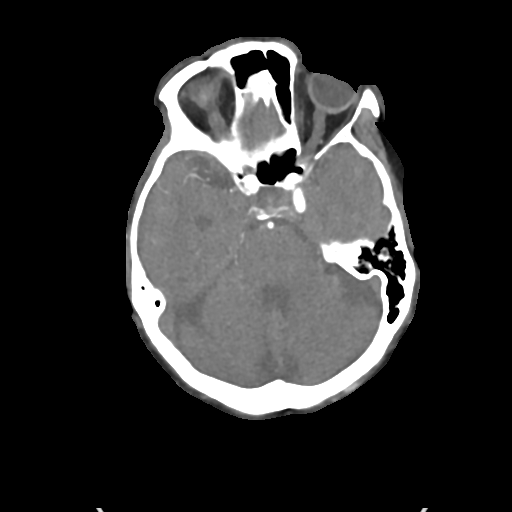

[Series 7: ax thins · axial · 0.39mm/px · z∈[-270,-22]mm · 6 of 349 slices shown]
[im 50/349  soft-tissue]
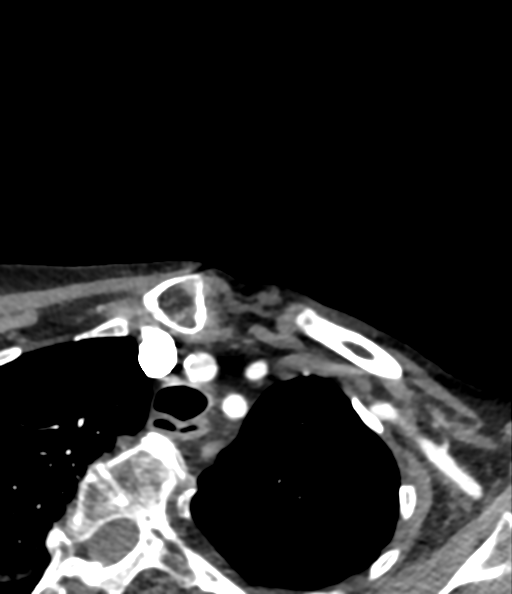
[im 100/349  bone]
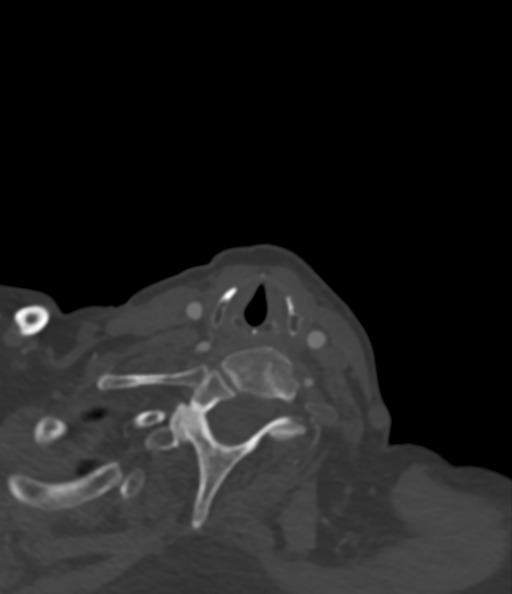
[im 150/349  soft-tissue]
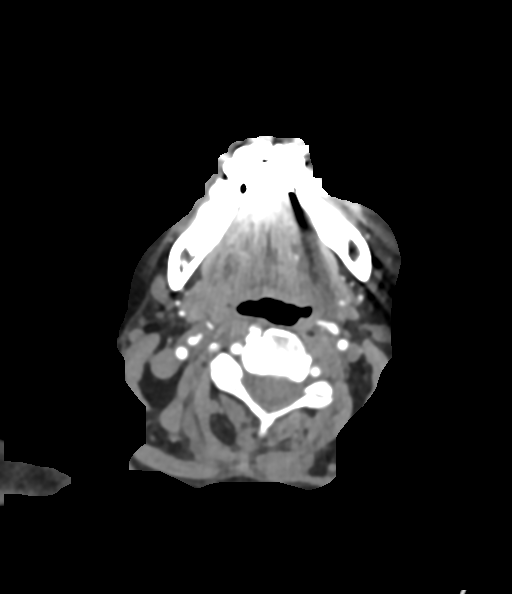
[im 199/349  bone]
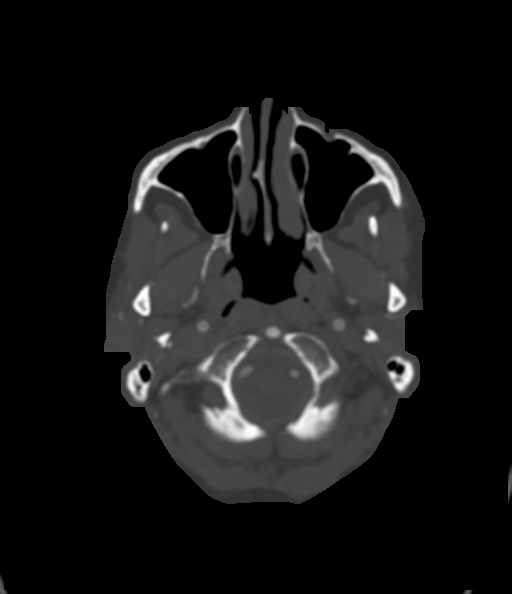
[im 249/349  soft-tissue]
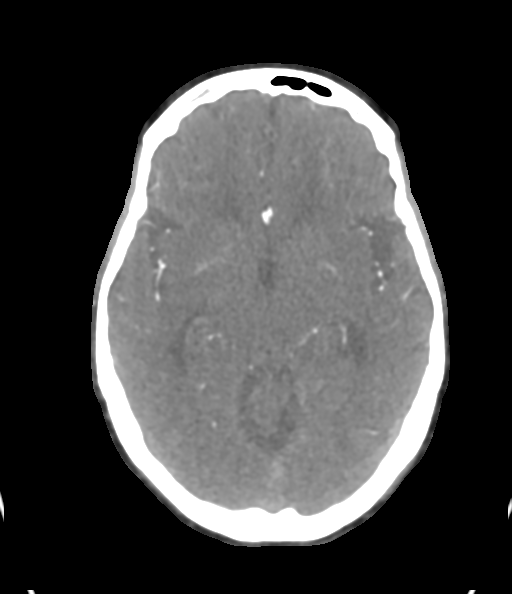
[im 299/349  bone]
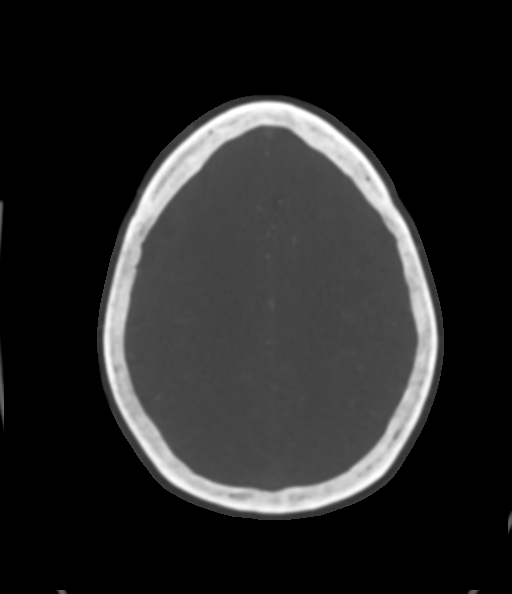

[Series 9: sag thins · sagittal · 0.44mm/px · 2 of 201 slices shown]
[im 39/201  soft-tissue]
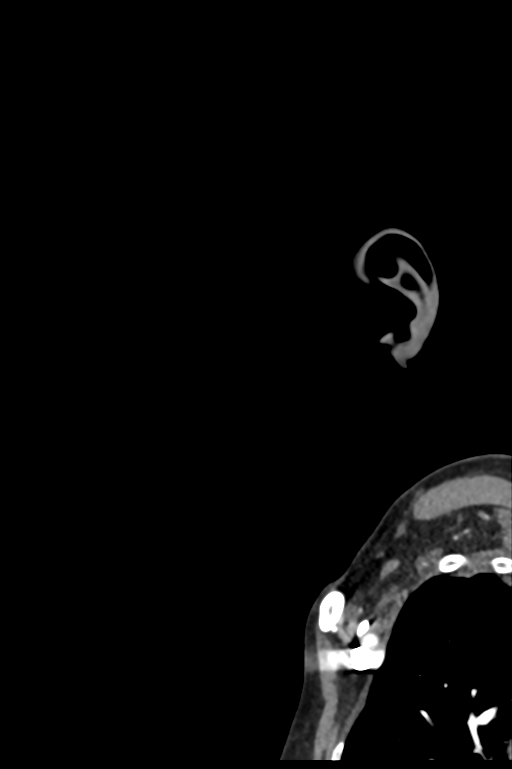
[im 163/201  soft-tissue]
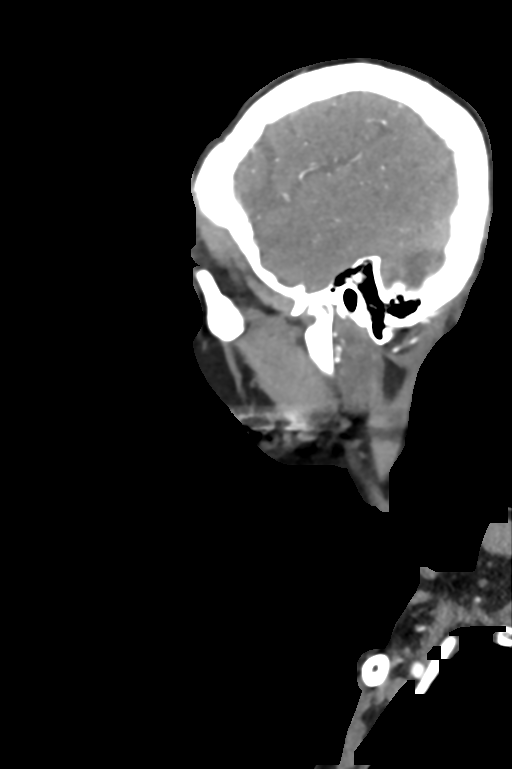

[10 of 36 positions shown; findings below may reference images not displayed]

FINDINGS: CTA NECK FINDINGS

Aortic arch: Common origin of the innominate and left common carotid
arteries atherosclerotic plaque within the visualized aortic arch
and proximal major branch vessels of the neck. No hemodynamically
significant innominate or proximal subclavian artery stenosis.

Right carotid system: CCA and ICA patent within the neck without
significant stenosis (50% or greater). Mild soft and calcified
plaque within the carotid bifurcation and proximal ICA.

Left carotid system: CCA and ICA patent within the neck without
significant stenosis (50% or greater). Mild soft and calcified
plaque within the carotid bifurcation and proximal ICA.

Vertebral arteries: Patent within the neck bilaterally without
significant stenosis. The right vertebral artery is subtly dominant
Nonstenotic calcified plaque at the origin of the left vertebral
artery.

Skeleton: Cervicothoracic levocurvature with partially imaged
dextrocurvature of the midthoracic spine.

Other neck: No neck mass or cervical lymphadenopathy. 7 mm right
thyroid lobe nodule not meeting consensus criteria for ultrasound
follow-up.

Upper chest: Mild atelectasis within the dependent right upper lobe.

Review of the MIP images confirms the above findings

CTA HEAD FINDINGS

Anterior circulation:

The intracranial internal carotid arteries are patent. Nonstenotic
calcified plaque within both vessels.

The M1 middle cerebral arteries are patent without significant
stenosis. No M2 proximal branch occlusion is identified. There is a
moderate to moderately severe stenosis within a proximal M2 left MCA
branch vessel (series 10, image 23).

The anterior cerebral arteries are patent.

4 mm laterally projecting aneurysm arising from the distal cavernous
right ICA (series 7, image 124) (series 9, image 96).

Posterior circulation:

The intracranial vertebral arteries are patent. The basilar artery
is patent. Incidentally noted fenestration within the proximal
basilar artery. The posterior cerebral arteries are patent. Moderate
stenosis within the P2 right posterior cerebral artery. Mild
atherosclerotic narrowing of the P2 left posterior cerebral artery.
Posterior communicating arteries are hypoplastic or absent
bilaterally.

Venous sinuses: Poorly assessed due to contrast bolus timing.

Anatomic variants: As described

Review of the MIP images confirms the above findings

No emergent large vessel occlusion. These results were called by
telephone at the time of interpretation on [DATE] at [DATE] to
provider DOLIVEIRA , who verbally acknowledged these
results.
IMPRESSION: CTA neck:

The common carotid, internal carotid and vertebral arteries are
patent within the neck without hemodynamically significant stenosis.
Mild soft and calcified plaque within the carotid bifurcations and
proximal ICAs bilaterally. Nonstenotic calcified plaque at the
origin of the non dominant left vertebral artery.

CTA head:

1. No intracranial large vessel occlusion.
2. Moderate to moderately severe focal stenosis within a proximal M2
left MCA branch vessel.
3. Moderate focal stenosis within the P2 right posterior cerebral
artery.
4. Mild focal stenosis within the P2 left posterior cerebral artery.
5. Incidentally noted fenestration of the proximal basilar artery.

ADDENDUM:
Findings omitted from the original impression: 4 mm laterally
projecting saccular aneurysm arising from the distal cavernous right
ICA.

These results will be called to the ordering clinician or
representative by the Radiologist Assistant, and communication
documented in the PACS or [REDACTED].

*** End of Addendum ***
FINDINGS: CTA NECK FINDINGS

Aortic arch: Common origin of the innominate and left common carotid
arteries atherosclerotic plaque within the visualized aortic arch
and proximal major branch vessels of the neck. No hemodynamically
significant innominate or proximal subclavian artery stenosis.

Right carotid system: CCA and ICA patent within the neck without
significant stenosis (50% or greater). Mild soft and calcified
plaque within the carotid bifurcation and proximal ICA.

Left carotid system: CCA and ICA patent within the neck without
significant stenosis (50% or greater). Mild soft and calcified
plaque within the carotid bifurcation and proximal ICA.

Vertebral arteries: Patent within the neck bilaterally without
significant stenosis. The right vertebral artery is subtly dominant
Nonstenotic calcified plaque at the origin of the left vertebral
artery.

Skeleton: Cervicothoracic levocurvature with partially imaged
dextrocurvature of the midthoracic spine.

Other neck: No neck mass or cervical lymphadenopathy. 7 mm right
thyroid lobe nodule not meeting consensus criteria for ultrasound
follow-up.

Upper chest: Mild atelectasis within the dependent right upper lobe.

Review of the MIP images confirms the above findings

CTA HEAD FINDINGS

Anterior circulation:

The intracranial internal carotid arteries are patent. Nonstenotic
calcified plaque within both vessels.

The M1 middle cerebral arteries are patent without significant
stenosis. No M2 proximal branch occlusion is identified. There is a
moderate to moderately severe stenosis within a proximal M2 left MCA
branch vessel (series 10, image 23).

The anterior cerebral arteries are patent.

4 mm laterally projecting aneurysm arising from the distal cavernous
right ICA (series 7, image 124) (series 9, image 96).

Posterior circulation:

The intracranial vertebral arteries are patent. The basilar artery
is patent. Incidentally noted fenestration within the proximal
basilar artery. The posterior cerebral arteries are patent. Moderate
stenosis within the P2 right posterior cerebral artery. Mild
atherosclerotic narrowing of the P2 left posterior cerebral artery.
Posterior communicating arteries are hypoplastic or absent
bilaterally.

Venous sinuses: Poorly assessed due to contrast bolus timing.

Anatomic variants: As described

Review of the MIP images confirms the above findings

No emergent large vessel occlusion. These results were called by
telephone at the time of interpretation on [DATE] at [DATE] to
provider DOLIVEIRA , who verbally acknowledged these
results.
IMPRESSION: CTA neck:

The common carotid, internal carotid and vertebral arteries are
patent within the neck without hemodynamically significant stenosis.
Mild soft and calcified plaque within the carotid bifurcations and
proximal ICAs bilaterally. Nonstenotic calcified plaque at the
origin of the non dominant left vertebral artery.

CTA head:

1. No intracranial large vessel occlusion.
2. Moderate to moderately severe focal stenosis within a proximal M2
left MCA branch vessel.
3. Moderate focal stenosis within the P2 right posterior cerebral
artery.
4. Mild focal stenosis within the P2 left posterior cerebral artery.
5. Incidentally noted fenestration of the proximal basilar artery.

## 2020-01-30 MED ORDER — SODIUM CHLORIDE 0.9 % IV SOLN
1.0000 g | INTRAVENOUS | Status: DC
  Administered 2020-01-31 – 2020-02-01 (×2): 1 g via INTRAVENOUS
  Filled 2020-01-30 (×2): qty 10

## 2020-01-30 MED ORDER — ACETAMINOPHEN 160 MG/5ML PO SOLN: 650.0000 mg | ORAL | Status: DC | PRN

## 2020-01-30 MED ORDER — ASPIRIN 300 MG RE SUPP
300.0000 mg | Freq: Every day | RECTAL | Status: DC
  Administered 2020-01-30: 300 mg via RECTAL
  Filled 2020-01-30: qty 1

## 2020-01-30 MED ORDER — LORAZEPAM 2 MG/ML IJ SOLN: 0.5000 mg | Freq: Once | INTRAMUSCULAR | Status: AC

## 2020-01-30 MED ORDER — LORAZEPAM 2 MG/ML IJ SOLN
INTRAMUSCULAR | Status: AC
  Administered 2020-01-30: 0.5 mg via INTRAVENOUS
  Filled 2020-01-30: qty 1

## 2020-01-30 MED ORDER — SODIUM CHLORIDE 0.9 % IV SOLN
1.0000 g | Freq: Once | INTRAVENOUS | Status: AC
  Administered 2020-01-30: 1 g via INTRAVENOUS
  Filled 2020-01-30: qty 10

## 2020-01-30 MED ORDER — LEVOTHYROXINE SODIUM 50 MCG PO TABS
50.0000 ug | ORAL_TABLET | ORAL | Status: DC
  Filled 2020-01-30: qty 1

## 2020-01-30 MED ORDER — ENOXAPARIN SODIUM 40 MG/0.4ML ~~LOC~~ SOLN: 40.0000 mg | SUBCUTANEOUS | Status: DC

## 2020-01-30 MED ORDER — ASPIRIN EC 81 MG PO TBEC: 81.0000 mg | DELAYED_RELEASE_TABLET | Freq: Every day | ORAL | Status: DC

## 2020-01-30 MED ORDER — MEMANTINE HCL 10 MG PO TABS
5.0000 mg | ORAL_TABLET | Freq: Every day | ORAL | Status: DC
  Administered 2020-02-01: 5 mg via ORAL
  Filled 2020-01-30 (×2): qty 1

## 2020-01-30 MED ORDER — IOHEXOL 350 MG/ML SOLN
75.0000 mL | Freq: Once | INTRAVENOUS | Status: AC | PRN
  Administered 2020-01-30: 75 mL via INTRAVENOUS

## 2020-01-30 MED ORDER — ACETAMINOPHEN 650 MG RE SUPP: 650.0000 mg | RECTAL | Status: DC | PRN

## 2020-01-30 MED ORDER — ACETAMINOPHEN 325 MG PO TABS: 650.0000 mg | ORAL_TABLET | ORAL | Status: DC | PRN

## 2020-01-30 MED ORDER — MIRTAZAPINE 15 MG PO TABS
7.5000 mg | ORAL_TABLET | Freq: Every day | ORAL | Status: DC
  Administered 2020-01-31 – 2020-02-01 (×2): 7.5 mg via ORAL
  Filled 2020-01-30 (×3): qty 1

## 2020-01-30 MED ORDER — SODIUM CHLORIDE 0.9 % IV BOLUS
500.0000 mL | Freq: Once | INTRAVENOUS | Status: AC
  Administered 2020-01-30: 500 mL via INTRAVENOUS

## 2020-01-30 MED ORDER — ASPIRIN 81 MG PO CHEW
81.0000 mg | CHEWABLE_TABLET | Freq: Every day | ORAL | Status: DC
  Administered 2020-01-31 – 2020-02-02 (×3): 81 mg via ORAL
  Filled 2020-01-30 (×4): qty 1

## 2020-01-30 MED ORDER — DONEPEZIL HCL 10 MG PO TABS
10.0000 mg | ORAL_TABLET | Freq: Every day | ORAL | Status: DC
  Administered 2020-01-31 – 2020-02-01 (×2): 10 mg via ORAL
  Filled 2020-01-30 (×3): qty 1

## 2020-01-30 MED ORDER — LEVOTHYROXINE SODIUM 25 MCG PO TABS
25.0000 ug | ORAL_TABLET | ORAL | Status: DC
  Administered 2020-02-02: 25 ug via ORAL
  Filled 2020-01-30: qty 1

## 2020-01-30 MED ORDER — STROKE: EARLY STAGES OF RECOVERY BOOK: Freq: Once | Status: DC

## 2020-01-30 MED ORDER — SODIUM CHLORIDE 0.9 % IV SOLN: INTRAVENOUS | Status: DC

## 2020-01-30 NOTE — ED Triage Notes (Signed)
Pt brought to ED via EMS from [facility] with c/o right sided weakness and aphasia, LKW approx 8AM. Pt alert but nonverbal on arrival to ED. Hx dementia per EMS. EDP and neuro present on arrival. Pt transported to CT.  EMS v/s: 112/78 110 HR 119 CBG 98% on room air

## 2020-01-30 NOTE — Progress Notes (Signed)
Brief Neuro Update:  Reviewed MRI Brain and is negative for a stroke. I suspect that her presentation was likely due to an underlying infection specially with the noted leukocytosis, UA concerning for a potential UTI. I called her daughter and updated her.  Neurology inpatient team will signoff. Please feel free to contact us with any questions or concerns. Thank you for letting us take care of her.  Erick Blinks [facility] Pager Number [phone]

## 2020-01-30 NOTE — ED Provider Notes (Signed)
[name] EMERGENCY DEPARTMENT Provider Note   CSN: [id number] Arrival date & time: 01/30/20  1102  An emergency department physician performed an initial assessment on this suspected stroke patient at 1104.  History Chief Complaint  Patient presents with  . Code Stroke    Isabel Zamora is a 86 y.o. female.  HPI  LEVEL 5 CAVEAT 30/63 AMS 86 year old female with a history of hypertension, hypercholesterolemia, hypothyroidism, aneurysm, polio presents to the ED as a code stroke.  History provided by the patient's daughter at bedside.  Patient has a history of dementia with short-term memory loss, lives at [facility].  At baseline she can ambulate w/ a walker, and take care of herself, but needs redirection as she has acute short-term memory loss.  Long-term memory is intact.  Patient was noted to be altered, with aphasia and right-sided weakness this morning by staff.  Last known normal was yesterday evening.  She has no prior history of strokes.  Not on anticoagulation.  Patient's daughter states that she does have a 70mm cerebral aneurysm but this has been stable for many years.  She is not on any hypertensive medications. Pt was noted to be incontinent of urine this morning which is abnormal for her.   No past medical history on file.  Patient Active Problem List   Diagnosis Date Noted  . Ischemic stroke (HCC) 01/30/2020  . UTI (urinary tract infection) 01/30/2020  . AKI (acute kidney injury) (HCC) 01/30/2020  . HTN (hypertension) 01/30/2020  . HLD (hyperlipidemia) 01/30/2020    No past surgical history on file.   OB History   No obstetric history on file.     No family history on file.  Social History   Tobacco Use  . Smoking status: Not on file  Substance Use Topics  . Alcohol use: Not on file  . Drug use: Not on file    Home Medications Prior to Admission medications   Not on File    Allergies    Morphine and related and  Penicillins  Review of Systems   Review of Systems  Unable to perform ROS: Mental status change    Physical Exam Updated Vital Signs BP (!) 110/94   Pulse 94   Temp 97.6 F (36.4 C) (Axillary)   Resp 16   SpO2 96%   Physical Exam Vitals and nursing note reviewed.  Constitutional:      General: She is not in acute distress.    Appearance: She is well-developed.  HENT:     Head: Normocephalic and atraumatic.  Eyes:     Conjunctiva/sclera: Conjunctivae normal.     Pupils: Pupils are equal, round, and reactive to light.     Comments: Left sided gaze preference   Cardiovascular:     Rate and Rhythm: Normal rate and regular rhythm.     Pulses: Normal pulses.     Heart sounds: Normal heart sounds. No murmur heard.   Pulmonary:     Effort: Pulmonary effort is normal. No respiratory distress.     Breath sounds: Normal breath sounds.  Abdominal:     General: Abdomen is flat.     Palpations: Abdomen is soft.     Tenderness: There is no abdominal tenderness.  Musculoskeletal:        General: No tenderness. Normal range of motion.     Cervical back: Neck supple.     Comments: Poor muscle tone   Skin:    General: Skin is warm and  dry.     Findings: No erythema.  Neurological:     Mental Status: She is alert.     Comments: Exam limited 2/2 to AMS. Alert, not responding to questions, will make eye contact. Follows sparse commands. No noticeable facial droop. Moving all 4 extremities, right grip strength weaker than left      ED Results / Procedures / Treatments   Labs (all labs ordered are listed, but only abnormal results are displayed) Labs Reviewed  APTT - Abnormal; Notable for the following components:      Result Value   aPTT 23 (*)    All other components within normal limits  CBC - Abnormal; Notable for the following components:   WBC 19.5 (*)    All other components within normal limits  DIFFERENTIAL - Abnormal; Notable for the following components:   Neutro  Abs 17.1 (*)    Monocytes Absolute 1.2 (*)    Abs Immature Granulocytes 0.09 (*)    All other components within normal limits  COMPREHENSIVE METABOLIC PANEL - Abnormal; Notable for the following components:   Glucose, Bld 110 (*)    Creatinine, Ser 1.08 (*)    GFR, Estimated 46 (*)    All other components within normal limits  URINALYSIS, ROUTINE W REFLEX MICROSCOPIC - Abnormal; Notable for the following components:   Color, Urine AMBER (*)    APPearance TURBID (*)    Specific Gravity, Urine >1.046 (*)    Glucose, UA 150 (*)    Hgb urine dipstick SMALL (*)    Ketones, ur 5 (*)    Protein, ur 30 (*)    Nitrite POSITIVE (*)    Leukocytes,Ua LARGE (*)    WBC, UA >50 (*)    Bacteria, UA RARE (*)    All other components within normal limits  I-STAT CHEM 8, ED - Abnormal; Notable for the following components:   Glucose, Bld 109 (*)    All other components within normal limits  CBG MONITORING, ED - Abnormal; Notable for the following components:   Glucose-Capillary 100 (*)    All other components within normal limits  RESPIRATORY PANEL BY RT PCR (FLU A&B, COVID)  URINE CULTURE  CULTURE, BLOOD (ROUTINE X 2)  CULTURE, BLOOD (ROUTINE X 2)  ETHANOL  PROTIME-INR  RAPID URINE DRUG SCREEN, HOSP PERFORMED  HEMOGLOBIN A1C  LDL CHOLESTEROL, DIRECT  CBC  CREATININE, SERUM  LACTIC ACID, PLASMA  LACTIC ACID, PLASMA    EKG None  Radiology CT HEAD CODE STROKE WO CONTRAST  Result Date: 01/30/2020 CLINICAL DATA:  Code stroke. Right-sided weakness, last known well 0500. EXAM: CT HEAD WITHOUT CONTRAST TECHNIQUE: Contiguous axial images were obtained from the base of the skull through the vertex without intravenous contrast. COMPARISON:  Head CT 08/11/2018. FINDINGS: Brain: Mild generalized cerebral atrophy. There is a small lacunar infarct within the posterior limb of left internal capsule which was not definitively present on the prior examination of 08/11/2018 and may be acute (series 3,  image 18). Stable background moderate ill-defined hypoattenuation within the cerebral white matter which is nonspecific, but consistent with chronic small vessel ischemic disease. There is no acute intracranial hemorrhage. No demarcated cortical infarct. No extra-axial fluid collection. No evidence of intracranial mass. No midline shift. Vascular: No hyperdense vessel. Skull: Normal. Negative for fracture or focal lesion. Sinuses/Orbits: Visualized orbits show no acute finding. No significant paranasal sinus disease or mastoid effusion at the imaged levels. ASPECTS [facility] Stroke Program Early CT Score) - Ganglionic level infarction (  caudate, lentiform nuclei, internal capsule, insula, M1-M3 cortex): 6 - Supraganglionic infarction (M4-M6 cortex): 3 Total score (0-10 with 10 being normal): 9 These results were communicated to Dr. Derry LoryKhaliqdina At 11:27 amon 10/15/2021by text page via the [facility] messaging system. IMPRESSION: Small lacunar infarct within the posterior limb of left internal capsule which was not definitively present on the prior head CT of 08/11/2018 and which may be acute. ASPECTS is 9. No acute intracranial hemorrhage or acute demarcated cortical infarction. Stable background mild generalized cerebral atrophy and moderate chronic small vessel ischemic disease. Electronically Signed   By: Jackey LogeKyle  Golden DO   On: 01/30/2020 11:32   CT ANGIO NECK CODE STROKE  Result Date: 01/30/2020 CLINICAL DATA:  Stroke/TIA, assess extracranial arteries. Stroke/TIA, assess intracranial arteries. Additional history provided: Suspected left MCA syndrome. EXAM: CT ANGIOGRAPHY HEAD AND NECK TECHNIQUE: Multidetector CT imaging of the head and neck was performed using the standard protocol during bolus administration of intravenous contrast. Multiplanar CT image reconstructions and MIPs were obtained to evaluate the vascular anatomy. Carotid stenosis measurements (when applicable) are obtained utilizing NASCET criteria,  using the distal internal carotid diameter as the denominator. CONTRAST:  Administered contrast not known at this time. COMPARISON:  Noncontrast head CT performed immediately prior. FINDINGS: CTA NECK FINDINGS Aortic arch: Common origin of the innominate and left common carotid arteries atherosclerotic plaque within the visualized aortic arch and proximal major branch vessels of the neck. No hemodynamically significant innominate or proximal subclavian artery stenosis. Right carotid system: CCA and ICA patent within the neck without significant stenosis (50% or greater). Mild soft and calcified plaque within the carotid bifurcation and proximal ICA. Left carotid system: CCA and ICA patent within the neck without significant stenosis (50% or greater). Mild soft and calcified plaque within the carotid bifurcation and proximal ICA. Vertebral arteries: Patent within the neck bilaterally without significant stenosis. The right vertebral artery is subtly dominant Nonstenotic calcified plaque at the origin of the left vertebral artery. Skeleton: Cervicothoracic levocurvature with partially imaged dextrocurvature of the midthoracic spine. Other neck: No neck mass or cervical lymphadenopathy. 7 mm right thyroid lobe nodule not meeting consensus criteria for ultrasound follow-up. Upper chest: Mild atelectasis within the dependent right upper lobe. Review of the MIP images confirms the above findings CTA HEAD FINDINGS Anterior circulation: The intracranial internal carotid arteries are patent. Nonstenotic calcified plaque within both vessels. The M1 middle cerebral arteries are patent without significant stenosis. No M2 proximal branch occlusion is identified. There is a moderate to moderately severe stenosis within a proximal M2 left MCA branch vessel (series 10, image 23). The anterior cerebral arteries are patent. 4 mm laterally projecting aneurysm arising from the distal cavernous right ICA (series 7, image 124) (series 9,  image 96). Posterior circulation: The intracranial vertebral arteries are patent. The basilar artery is patent. 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 The posterior cerebral arteries are patent. Moderate stenosis within the P2 right posterior cerebral artery. Mild atherosclerotic narrowing of the P2 left posterior cerebral artery. Posterior communicating arteries are hypoplastic or absent bilaterally. Venous sinuses: Poorly assessed due to contrast bolus timing. Anatomic variants: As described Review of the MIP images confirms the above findings No emergent large vessel occlusion. These results were called by telephone at the time of interpretation on 01/30/2020 at 11:40 am to provider [facility] KHALIQDINA , who verbally acknowledged these results. IMPRESSION: CTA neck: The common carotid, internal carotid and vertebral arteries are patent within the neck without hemodynamically significant stenosis. Mild  soft and calcified plaque within the carotid bifurcations and proximal ICAs bilaterally. Nonstenotic calcified plaque at the origin of the non dominant left vertebral artery. CTA head: 1. No intracranial large vessel occlusion. 2. Moderate to moderately severe focal stenosis within a proximal M2 left MCA branch vessel. 3. Moderate focal stenosis within the P2 right posterior cerebral artery. 4. Mild focal stenosis within the P2 left posterior cerebral artery. 5. 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 Electronically Signed   By: Jackey Loge DO   On: 01/30/2020 11:59    Procedures Procedures (including critical care time)  Medications Ordered in ED Medications  aspirin chewable tablet 81 mg ( Oral See Alternative 01/30/20 1143)    Or  aspirin suppository 300 mg (300 mg Rectal Given 01/30/20 1143)   stroke: mapping our early stages of recovery book (has no administration in time range)  acetaminophen (TYLENOL) tablet 650 mg (has no administration in time  range)    Or  acetaminophen (TYLENOL) 160 MG/5ML solution 650 mg (has no administration in time range)    Or  acetaminophen (TYLENOL) suppository 650 mg (has no administration in time range)  enoxaparin (LOVENOX) injection 40 mg (has no administration in time range)  cefTRIAXone (ROCEPHIN) 1 g in sodium chloride 0.9 % 100 mL IVPB (has no administration in time range)  sodium chloride 0.9 % bolus 500 mL (has no administration in time range)  iohexol (OMNIPAQUE) 350 MG/ML injection 75 mL (75 mLs Intravenous Contrast Given 01/30/20 1138)    ED Course  I have reviewed the triage vital signs and the nursing notes.  Pertinent labs & imaging results that were available during my care of the patient were reviewed by me and considered in my medical decision making (see chart for details).    MDM Rules/Calculators/A&P                         86 year old female who presents to the ER as a code stroke On presentation, the patient is alert, however not responding to most commands, appears to have a left-sided gaze preference.  On exam she does seem to have a weaker right grip strength.  Her vitals however with a blood pressure of 114/57 on arrival, afebrile, not tachycardic or tachypneic.  Abdomen is soft and nontender.  Patient was seen and evaluated by neurology, CT of the head for code stroke did note a small lacunar infarct in the left internal capsule which was not clearly seen on previous imaging, as they suspect that this may be acute.  She is outside of the TPA window and given no clear clot, no indication for thrombectomy.  Her CTA was without any acute changes.  Spoke with Dr.Khaliqdina with neurology, plan was to do an MRI and admit the patient to medicine.  Her CMP shows a mild AKI 1.08, and a white count of 19.5.  Her UA has positive nitrites and large leukocytes with more than 50 WBCs.  EKG with sinus tach.  She has no prior history of seizures.  Low concern for meningitis.  Suspect that the UTI  is also contributing to her symptoms.  Given patient soft BPs, will start on IV Rocephin (patient does have a penicillin allergy listed in her medications, however I spoke with her daughter who states that she is not sure if the patient has a true allergy or not.  She states that if she does, her response is not anaphylactic and is okay  to proceed forward with giving Rocephin).  We will start on gentle fluid rehydration as well, urine culture pending Covid pending. MRI of the brain pending.  Code sepsis was not initiated at this time.  Spoke with Dr. Jacqulyn Bath with the hospitalist team who will see and admit the patient for further management.  Final Clinical Impression(s) / ED Diagnoses Final diagnoses:  Acute ischemic stroke (HCC)  AKI (acute kidney injury) (HCC)  Acute cystitis with hematuria    Rx / DC Orders ED Discharge Orders    None       Leone Brand 01/30/20 1315    Arby Barrette, MD 02/02/20 1326

## 2020-01-30 NOTE — Code Documentation (Signed)
Pt is an 86 yr old resident of [facility] who was last seen well this morning at 0500. Code stroke was called at 1055 when nursing home staff noted pt to be aphasic. At that time she had right sided weakness. Pt arrived to [facility] at 1103 via [facility]. She is mute, left sided gaze, and moved all extremities equally. Full NIH SS 19. Labs and CBG obtained at 1103 and airway cleared by EDP at 1104. Pt to CT 1 at 1105. Per Dr Derry Lory, NCCT negative for acute hemmorrhage. CTA obtained at 1115. Pt to ED room 26 at 1130. Per Dr Derry Lory, CTA is negative for thrombus. Pt not eligible for TPA as she is outside the treatment window.Pt not eligible for NIR as LVO negative. She also has a MRS of 3. Rectal aspirin suppository given at 1143. Handoff complete with ED RN Amber. Pt will need q 2 hr VS and NIHSS for 12 hrs, then q 4 hr. Pt's daughter Isabel Zamora was updated by Dr Derry Lory at pt's bedside.
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
## 2020-01-30 NOTE — ED Notes (Signed)
Pt transported to MRI 

## 2020-01-30 NOTE — H&P (Signed)
History and Physical    Isabel Zamora ZOX:[id number] DOB: 1929-12-17 DOA: 01/30/2020  PCP: Patient, No Pcp Per  Patient coming from: [facility]  I have personally briefly reviewed patient's old medical records in [facility]  Chief Complaint: Right-sided weakness and aphasia?  HPI: Isabel Zamora is a 86 y.o. female with medical history significant of hypertension, hyperlipidemia, dementia, cerebral aneurysm, hypothyroidism, post polio syndrome, kyphosis, presents to emergency department with right-sided weakness and aphasia.  She was last seen normal by facility staff around 5 in the morning.  Patient was noted altered, aphasic and had right-sided weakness this morning noted by staff.  No history of head trauma, seizures, loss of consciousness, chest pain, vomiting, diarrhea, fever, cough, congestion.  At baseline: She walks with the help of a walker, takes shower herself however needs redirection if she has acute short-term memory loss.   She has history of hypertension however her antihypertensive medication was discontinued as her blood pressure was running on lower side.  She has history of hyperlipidemia however not taking any statin due to side effects.  No history of dysuria, hematuria, back pain, nausea, vomiting, fever, chills, lower abdominal pain,  however this morning staff noted that she had urinary incontinence and foul-smelling urine.  She has history of chronic increase urinary frequency at baseline.  No history of smoking, alcohol, street drug use.  ED Course: Upon arrival to ED: Patient afebrile, vital signs stable, maintaining oxygen saturation on room air, sleepy but arousable and sometimes follows commands.  CBC shows leukocytosis of 19.5, BMP shows mild AKI, UDS, ethanol, PT/INR: WNL, UA positive for infection.  COVID-19: Pending.  Patient received aspirin, IV Rocephin, IV fluid in ED.  CT head is concerning for acute lacunar infarction.  CTA head and neck  and MRI brain is pending.  Review of Systems: As per HPI otherwise negative.    Past Medical History:  Diagnosis Date   Dementia (HCC)    HLD (hyperlipidemia)    HTN (hypertension)    Hypothyroidism    Kyphosis     History reviewed. No pertinent surgical history.   reports that she has never smoked. She has never used smokeless tobacco. She reports that she does not drink alcohol and does not use drugs.  Allergies  Allergen Reactions   Morphine And Related    Penicillins     History reviewed. No pertinent family history.  Prior to Admission medications   Not on File    Physical Exam: Vitals:   01/30/20 1355 01/30/20 1405 01/30/20 1500 01/30/20 1515  BP: (!) 107/57 (!) 110/53 (!) 110/53 (!) 99/53  Pulse:   79 78  Resp:   19 (!) 21  Temp:      TempSrc:      SpO2:  93% 94% 96%    Constitutional: NAD, calm, comfortable, on room air, sleepy but arousable, not very cooperative with exam.  Not completely aphasic. Says alright & Yes Eyes: Pinpoint pupil, lids and conjunctivae normal ENMT: Mucous membranes are moist. Posterior pharynx clear of any exudate or lesions.Normal dentition.  Neck: normal, supple, no masses, no thyromegaly Respiratory: clear to auscultation bilaterally, no wheezing, no crackles. Normal respiratory effort. No accessory muscle use.  Cardiovascular: Regular rate and rhythm, no murmurs / rubs / gallops. No extremity edema. 2+ pedal pulses. No carotid bruits.  Abdomen: no tenderness, no masses palpated. No hepatosplenomegaly. Bowel sounds positive.  Skin: no rashes, lesions, ulcers. No induration Neurologic: Unable to perform full neurological exam as  patient is not cooperative.  Opens her eyes with voice however goes back to sleep easily.   Labs on Admission: I have personally reviewed following labs and imaging studies  CBC: Recent Labs  Lab 01/30/20 1107 01/30/20 1112  WBC 19.5*  --   NEUTROABS 17.1*  --   HGB 12.9 13.6  HCT 40.4  40.0  MCV 93.5  --   PLT 307  --    Basic Metabolic Panel: Recent Labs  Lab 01/30/20 1107 01/30/20 1112 01/30/20 1530  NA 140 140  --   K 4.0 4.1  --   CL 104 105  --   CO2 24  --   --   GLUCOSE 110* 109*  --   BUN 17 23  --   CREATININE 1.08* 1.00 0.82  CALCIUM 9.6  --   --    GFR: CrCl cannot be calculated (Unknown ideal weight.). Liver Function Tests: Recent Labs  Lab 01/30/20 1107  AST 38  ALT 21  ALKPHOS 82  BILITOT 0.7  PROT 7.2  ALBUMIN 3.8   No results for input(s): LIPASE, AMYLASE in the last 168 hours. No results for input(s): AMMONIA in the last 168 hours. Coagulation Profile: Recent Labs  Lab 01/30/20 1107  INR 1.0   Cardiac Enzymes: No results for input(s): CKTOTAL, CKMB, CKMBINDEX, TROPONINI in the last 168 hours. BNP (last 3 results) No results for input(s): PROBNP in the last 8760 hours. HbA1C: No results for input(s): HGBA1C in the last 72 hours. CBG: Recent Labs  Lab 01/30/20 1108  GLUCAP 100*   Lipid Profile: Recent Labs    01/30/20 1107  LDLDIRECT 239.8*   Thyroid Function Tests: No results for input(s): TSH, T4TOTAL, FREET4, T3FREE, THYROIDAB in the last 72 hours. Anemia Panel: No results for input(s): VITAMINB12, FOLATE, FERRITIN, TIBC, IRON, RETICCTPCT in the last 72 hours. Urine analysis:    Component Value Date/Time   COLORURINE AMBER (A) 01/30/2020 1155   APPEARANCEUR TURBID (A) 01/30/2020 1155   LABSPEC >1.046 (H) 01/30/2020 1155   PHURINE 5.0 01/30/2020 1155   GLUCOSEU 150 (A) 01/30/2020 1155   HGBUR SMALL (A) 01/30/2020 1155   BILIRUBINUR NEGATIVE 01/30/2020 1155   KETONESUR 5 (A) 01/30/2020 1155   PROTEINUR 30 (A) 01/30/2020 1155   NITRITE POSITIVE (A) 01/30/2020 1155   LEUKOCYTESUR LARGE (A) 01/30/2020 1155    Radiological Exams on Admission: DG Chest Portable 1 View  Result Date: 01/30/2020 CLINICAL DATA:  Right-sided weakness and aphasia. EXAM: PORTABLE CHEST 1 VIEW COMPARISON:  Chest x-ray dated [place]  26, 2020. FINDINGS: The heart size and mediastinal contours are within normal limits. Normal pulmonary vascularity. No focal consolidation, pleural effusion, or pneumothorax. No acute osseous abnormality. Unchanged severe thoracolumbar scoliosis. IMPRESSION: No active disease. Electronically Signed   By: Obie Dredge M.D.   On: 01/30/2020 12:54   CT HEAD CODE STROKE WO CONTRAST  Result Date: 01/30/2020 CLINICAL DATA:  Code stroke. Right-sided weakness, last known well 0500. EXAM: CT HEAD WITHOUT CONTRAST TECHNIQUE: Contiguous axial images were obtained from the base of the skull through the vertex without intravenous contrast. COMPARISON:  Head CT 08/11/2018. FINDINGS: Brain: Mild generalized cerebral atrophy. There is a small lacunar infarct within the posterior limb of left internal capsule which was not definitively present on the prior examination of 08/11/2018 and may be acute (series 3, image 18). Stable background moderate ill-defined hypoattenuation within the cerebral white matter which is nonspecific, but consistent with chronic small vessel ischemic disease. There is no  acute intracranial hemorrhage. No demarcated cortical infarct. No extra-axial fluid collection. No evidence of intracranial mass. No midline shift. Vascular: No hyperdense vessel. Skull: Normal. Negative for fracture or focal lesion. Sinuses/Orbits: Visualized orbits show no acute finding. No significant paranasal sinus disease or mastoid effusion at the imaged levels. ASPECTS [facility] Stroke Program Early CT Score) - Ganglionic level infarction (caudate, lentiform nuclei, internal capsule, insula, M1-M3 cortex): 6 - Supraganglionic infarction (M4-M6 cortex): 3 Total score (0-10 with 10 being normal): 9 These results were communicated to Dr. Derry Lory At 11:27 amon 10/15/2021by text page via the [facility] messaging system. IMPRESSION: Small lacunar infarct within the posterior limb of left internal capsule which was not definitively  present on the prior head CT of 08/11/2018 and which may be acute. ASPECTS is 9. No acute intracranial hemorrhage or acute demarcated cortical infarction. Stable background mild generalized cerebral atrophy and moderate chronic small vessel ischemic disease. Electronically Signed   By: Jackey Loge DO   On: 01/30/2020 11:32   CT ANGIO HEAD CODE STROKE  Result Date: 01/30/2020 CLINICAL DATA:  Stroke/TIA, assess extracranial arteries. Stroke/TIA, assess intracranial arteries. Additional history provided: Suspected left MCA syndrome. EXAM: CT ANGIOGRAPHY HEAD AND NECK TECHNIQUE: Multidetector CT imaging of the head and neck was performed using the standard protocol during bolus administration of intravenous contrast. Multiplanar CT image reconstructions and MIPs were obtained to evaluate the vascular anatomy. Carotid stenosis measurements (when applicable) are obtained utilizing NASCET criteria, using the distal internal carotid diameter as the denominator. CONTRAST:  Administered contrast not known at this time. COMPARISON:  Noncontrast head CT performed immediately prior. FINDINGS: CTA NECK FINDINGS Aortic arch: Common origin of the innominate and left common carotid arteries atherosclerotic plaque within the visualized aortic arch and proximal major branch vessels of the neck. No hemodynamically significant innominate or proximal subclavian artery stenosis. Right carotid system: CCA and ICA patent within the neck without significant stenosis (50% or greater). Mild soft and calcified plaque within the carotid bifurcation and proximal ICA. Left carotid system: CCA and ICA patent within the neck without significant stenosis (50% or greater). Mild soft and calcified plaque within the carotid bifurcation and proximal ICA. Vertebral arteries: Patent within the neck bilaterally without significant stenosis. The right vertebral artery is subtly dominant Nonstenotic calcified plaque at the origin of the left vertebral  artery. Skeleton: Cervicothoracic levocurvature with partially imaged dextrocurvature of the midthoracic spine. Other neck: No neck mass or cervical lymphadenopathy. 7 mm right thyroid lobe nodule not meeting consensus criteria for ultrasound follow-up. Upper chest: Mild atelectasis within the dependent right upper lobe. Review of the MIP images confirms the above findings CTA HEAD FINDINGS Anterior circulation: The intracranial internal carotid arteries are patent. Nonstenotic calcified plaque within both vessels. The M1 middle cerebral arteries are patent without significant stenosis. No M2 proximal branch occlusion is identified. There is a moderate to moderately severe stenosis within a proximal M2 left MCA branch vessel (series 10, image 23). The anterior cerebral arteries are patent. 4 mm laterally projecting aneurysm arising from the distal cavernous right ICA (series 7, image 124) (series 9, image 96). Posterior circulation: The intracranial vertebral arteries are patent. The basilar artery is patent. 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 The posterior cerebral arteries are patent. Moderate stenosis within the P2 right posterior cerebral artery. Mild atherosclerotic narrowing of the P2 left posterior cerebral artery. Posterior communicating arteries are hypoplastic or absent bilaterally. Venous sinuses: Poorly assessed due to contrast bolus timing. Anatomic variants: As described  Review of the MIP images confirms the above findings No emergent large vessel occlusion. These results were called by telephone at the time of interpretation on 01/30/2020 at 11:40 am to provider [facility] , who verbally acknowledged these results. IMPRESSION: CTA neck: The common carotid, internal carotid and vertebral arteries are patent within the neck without hemodynamically significant stenosis. Mild soft and calcified plaque within the carotid bifurcations and proximal ICAs bilaterally.  Nonstenotic calcified plaque at the origin of the non dominant left vertebral artery. CTA head: 1. No intracranial large vessel occlusion. 2. Moderate to moderately severe focal stenosis within a proximal M2 left MCA branch vessel. 3. Moderate focal stenosis within the P2 right posterior cerebral artery. 4. Mild focal stenosis within the P2 left posterior cerebral artery. 5. 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 Electronically Signed   By: Jackey LogeKyle  Golden DO   On: 01/30/2020 11:59   CT ANGIO NECK CODE STROKE  Result Date: 01/30/2020 CLINICAL DATA:  Stroke/TIA, assess extracranial arteries. Stroke/TIA, assess intracranial arteries. Additional history provided: Suspected left MCA syndrome. EXAM: CT ANGIOGRAPHY HEAD AND NECK TECHNIQUE: Multidetector CT imaging of the head and neck was performed using the standard protocol during bolus administration of intravenous contrast. Multiplanar CT image reconstructions and MIPs were obtained to evaluate the vascular anatomy. Carotid stenosis measurements (when applicable) are obtained utilizing NASCET criteria, using the distal internal carotid diameter as the denominator. CONTRAST:  Administered contrast not known at this time. COMPARISON:  Noncontrast head CT performed immediately prior. FINDINGS: CTA NECK FINDINGS Aortic arch: Common origin of the innominate and left common carotid arteries atherosclerotic plaque within the visualized aortic arch and proximal major branch vessels of the neck. No hemodynamically significant innominate or proximal subclavian artery stenosis. Right carotid system: CCA and ICA patent within the neck without significant stenosis (50% or greater). Mild soft and calcified plaque within the carotid bifurcation and proximal ICA. Left carotid system: CCA and ICA patent within the neck without significant stenosis (50% or greater). Mild soft and calcified plaque within the carotid bifurcation and proximal ICA. Vertebral  arteries: Patent within the neck bilaterally without significant stenosis. The right vertebral artery is subtly dominant Nonstenotic calcified plaque at the origin of the left vertebral artery. Skeleton: Cervicothoracic levocurvature with partially imaged dextrocurvature of the midthoracic spine. Other neck: No neck mass or cervical lymphadenopathy. 7 mm right thyroid lobe nodule not meeting consensus criteria for ultrasound follow-up. Upper chest: Mild atelectasis within the dependent right upper lobe. Review of the MIP images confirms the above findings CTA HEAD FINDINGS Anterior circulation: The intracranial internal carotid arteries are patent. Nonstenotic calcified plaque within both vessels. The M1 middle cerebral arteries are patent without significant stenosis. No M2 proximal branch occlusion is identified. There is a moderate to moderately severe stenosis within a proximal M2 left MCA branch vessel (series 10, image 23). The anterior cerebral arteries are patent. 4 mm laterally projecting aneurysm arising from the distal cavernous right ICA (series 7, image 124) (series 9, image 96). Posterior circulation: The intracranial vertebral arteries are patent. The basilar artery is patent. 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 The posterior cerebral arteries are patent. Moderate stenosis within the P2 right posterior cerebral artery. Mild atherosclerotic narrowing of the P2 left posterior cerebral artery. Posterior communicating arteries are hypoplastic or absent bilaterally. Venous sinuses: Poorly assessed due to contrast bolus timing. Anatomic variants: As described Review of the MIP images confirms the above findings No emergent large vessel occlusion. These results  were called by telephone at the time of interpretation on 01/30/2020 at 11:40 am to provider [facility] , who verbally acknowledged these results. IMPRESSION: CTA neck: The common carotid, internal carotid and  vertebral arteries are patent within the neck without hemodynamically significant stenosis. Mild soft and calcified plaque within the carotid bifurcations and proximal ICAs bilaterally. Nonstenotic calcified plaque at the origin of the non dominant left vertebral artery. CTA head: 1. No intracranial large vessel occlusion. 2. Moderate to moderately severe focal stenosis within a proximal M2 left MCA branch vessel. 3. Moderate focal stenosis within the P2 right posterior cerebral artery. 4. Mild focal stenosis within the P2 left posterior cerebral artery. 5. 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 Electronically Signed   By: Jackey Loge DO   On: 01/30/2020 11:59    EKG: Independently reviewed.  Sinus tachycardia, right atrial enlargement.  Left anterior fascicular block.  LVH.  Assessment/Plan Principal Problem:   Acute metabolic encephalopathy Active Problems:   UTI (urinary tract infection)   AKI (acute kidney injury) (HCC)   HTN (hypertension)   HLD (hyperlipidemia)    Acute metabolic encephalopathy: -Patient presented with AMS, right-sided weakness and aphasia?  Likely in the setting of UTI.  - MRI brain came back negative for acute findings. -Review CTA head and neck.  No thrombosis.  -UA positive for infection.  She is afebrile with leukocytosis of 19,000.  Continue gentle hydration and IV Rocephin.  Blood culture, urine culture: Pending. -Frequent neuro checks -Neurology signed off. -PT/OT eval, Speech consult  AKI: Continue with gentle hydration.  Avoid nephrotoxic medication.  Repeat BMP tomorrow a.m.  Hypertension: Not on any medications at home.  Continue to monitor.  Hyperlipidemia: Check lipid panel.  Not on a statin due to side effects.  Dementia: Continue home meds memantine, Aricept, Remeron  Hypothyroidism: Check TSH, continue levothyroxine.  DVT prophylaxis: Lovenox/SCD Code Status: DNR-confirmed with patient's daughter Family  Communication: Patient's daughter present at bedside.  Plan of care discussed with patient in length and he verbalized understanding and agreed with it. Disposition Plan: SNF in 1 to 2 days Consults called: Neurology Admission status: Inpatient   Ollen Bowl MD [facility]  If 7PM-7AM, please contact night-coverage [email or web address] Password [email or web address]  01/30/2020, 4:16 PM

## 2020-01-30 NOTE — CV Procedure (Signed)
Echocardiogram not completed, patient going to MRI.  Leta Jungling RDCS

## 2020-01-31 DIAGNOSIS — I10 Essential (primary) hypertension: Secondary | ICD-10-CM

## 2020-01-31 LAB — LIPID PANEL
Cholesterol: 235 mg/dL — ABNORMAL HIGH (ref 0–200)
HDL: 62 mg/dL (ref >40)
LDL Cholesterol: 160 mg/dL — ABNORMAL HIGH (ref 0–99)
Total CHOL/HDL Ratio: 3.8 RATIO
Triglycerides: 63 mg/dL (ref <150)
VLDL: 13 mg/dL (ref 0–40)

## 2020-01-31 LAB — BASIC METABOLIC PANEL
Anion gap: 11 (ref 5–15)
BUN: 16 mg/dL (ref 8–23)
CO2: 20 mmol/L — ABNORMAL LOW (ref 22–32)
Calcium: 8.7 mg/dL — ABNORMAL LOW (ref 8.9–10.3)
Chloride: 110 mmol/L (ref 98–111)
Creatinine, Ser: 0.82 mg/dL (ref 0.44–1.00)
GFR, Estimated: >60 mL/min (ref >60)
Glucose, Bld: 91 mg/dL (ref 70–99)
Potassium: 3.5 mmol/L (ref 3.5–5.1)
Sodium: 141 mmol/L (ref 135–145)

## 2020-01-31 LAB — CBC
HCT: 32.2 % — ABNORMAL LOW (ref 36.0–46.0)
Hemoglobin: 10.4 g/dL — ABNORMAL LOW (ref 12.0–15.0)
MCH: 30.2 pg (ref 26.0–34.0)
MCHC: 32.3 g/dL (ref 30.0–36.0)
MCV: 93.6 fL (ref 80.0–100.0)
Platelets: 309 10*3/uL (ref 150–400)
RBC: 3.44 MIL/uL — ABNORMAL LOW (ref 3.87–5.11)
RDW: 14.6 % (ref 11.5–15.5)
WBC: 12.4 10*3/uL — ABNORMAL HIGH (ref 4.0–10.5)
nRBC: 0 % (ref 0.0–0.2)

## 2020-01-31 LAB — HEMOGLOBIN A1C
Hgb A1c MFr Bld: 5.6 % (ref 4.8–5.6)
Mean Plasma Glucose: 114 mg/dL

## 2020-01-31 MED ORDER — HALOPERIDOL LACTATE 5 MG/ML IJ SOLN
5.0000 mg | Freq: Four times a day (QID) | INTRAMUSCULAR | Status: DC | PRN
  Administered 2020-01-31 – 2020-02-01 (×3): 5 mg via INTRAVENOUS
  Filled 2020-01-31 (×3): qty 1

## 2020-01-31 NOTE — Evaluation (Signed)
Clinical/Bedside Swallow Evaluation Patient Details  Name: Isabel Zamora MRN: [id number] Date of Birth: 12-Aug-1929  Today's Date: 01/31/2020 Time: SLP Start Time (ACUTE ONLY): 0814 SLP Stop Time (ACUTE ONLY): 0939 SLP Time Calculation (min) (ACUTE ONLY): 16 min  Past Medical History:  Past Medical History:  Diagnosis Date  . Dementia (HCC)   . HLD (hyperlipidemia)   . HTN (hypertension)   . Hypothyroidism   . Kyphosis    Past Surgical History: History reviewed. No pertinent surgical history. HPI:  Pt is an 87 yo female presenting with R sided weakness and aphasia with concern for L gaze preference. MRI and CXR negative for acute findings. Differential dx includes UTI. PMH: dementia (poor STM/intact LTM per MD note), hypothyroidism, HTN, HLD, cerebral aneurysm, post polio syndrome, kyphosis   Assessment / Plan / Recommendation Clinical Impression  Pt seems to have a cognitively based dysphagia with underlying mechanisms of swallowing appearing functional during PO trials. She remains quite altered but is talking more. Language output is becoming more fluent compared to documentation of initial presentation, but it's still telegraphic, nonsensical. Pt has poor awareness of boluses and difficulty initiating and stopping tasks. No overt s/s of aspiration are noted though. Recommend starting with Dys 2 (chopped) diet and thin liquids with full supervision. Suspect that her intake may be reduced at first, but that it could improve if we start to see improvements in her mentation. SLP will continue to follow.   SLP Visit Diagnosis: Dysphagia, unspecified (R13.10)    Aspiration Risk  Risk for inadequate nutrition/hydration;Mild aspiration risk;Moderate aspiration risk    Diet Recommendation Dysphagia 2 (Fine chop);Thin liquid   Liquid Administration via: Cup;Straw Medication Administration: Crushed with puree Supervision: Staff to assist with self feeding;Full supervision/cueing for  compensatory strategies Compensations: Minimize environmental distractions Postural Changes: Seated upright at 90 degrees    Other  Recommendations Oral Care Recommendations: Oral care BID   Follow up Recommendations  (tba)      Frequency and Duration min 2x/week  2 weeks       Prognosis Prognosis for Safe Diet Advancement: Good Barriers to Reach Goals: Cognitive deficits;Language deficits      Swallow Study   General HPI: Pt is an 86 yo female presenting with R sided weakness and aphasia with concern for L gaze preference. MRI and CXR negative for acute findings. Differential dx includes UTI. PMH: dementia (poor STM/intact LTM per MD note), hypothyroidism, HTN, HLD, cerebral aneurysm, post polio syndrome, kyphosis Type of Study: Bedside Swallow Evaluation Previous Swallow Assessment: none in chart Diet Prior to this Study: NPO Temperature Spikes Noted: No Respiratory Status: Room air History of Recent Intubation: No Behavior/Cognition: Alert;Doesn't follow directions Oral Care Completed by SLP: No Oral Cavity - Dentition: Adequate natural dentition Self-Feeding Abilities: Total assist Patient Positioning: Upright in bed Baseline Vocal Quality: Normal    Oral/Motor/Sensory Function Overall Oral Motor/Sensory Function:  (not following commands but no overt focal weakness)   Ice Chips Ice chips: Not tested (pt did not accept ice chip from spoon)   Thin Liquid Thin Liquid: Impaired Presentation: Cup;Spoon;Straw;Self Fed Oral Phase Impairments: Poor awareness of bolus    Nectar Thick Nectar Thick Liquid: Not tested   Honey Thick Honey Thick Liquid: Not tested   Puree Puree: Not tested (pt would not accept applesauce from the spoon)   Solid     Solid: Impaired Oral Phase Impairments: Poor awareness of bolus      Mahala Menghini., M.A. CCC-SLP Acute Herbalist [phone] Office (  [phone]  01/31/2020,9:49 AM

## 2020-01-31 NOTE — Evaluation (Signed)
Physical Therapy Evaluation Patient Details Name: Isabel Zamora MRN: [id number] DOB: 1929-12-16 Today's Date: 01/31/2020   History of Present Illness  Pt is an 86 yo female presenting with R sided weakness and aphasia with concern for L gaze preference. MRI and CXR negative for acute findings. Differential dx includes UTI. PMH: dementia (poor STM/intact LTM per MD note), hypothyroidism, HTN, HLD, cerebral aneurysm, post polio syndrome, kyphosis    Clinical Impression  Daughter said pt unable to bear weight on her legs yesterday - improved today.  Pt is impulsive and not following directions.  She tries constantly to pull out her IV.  She did walk with +2 assist 150 feet with mod assist.  Pt tolerated sitting up 15 min in chair with daughters direct attention - then had to be assisted back to bed as daughter unable to keep her safe in chair.  At this point anticipate pt will need 24/7 care at DC and follow up PT.    Follow Up Recommendations Home health PT;Supervision/Assistance - 24 hour    Equipment Recommendations  None recommended by PT    Recommendations for Other Services       Precautions / Restrictions Precautions Precautions: Fall Precaution Comments: Pt needing direct 24/7 supervision if not restrained due to aggitation      Mobility  Bed Mobility Overal bed mobility: Needs Assistance Bed Mobility: Supine to Sit;Sit to Supine     Supine to sit: +2 for physical assistance;Max assist Sit to supine: +2 for physical assistance;Max assist   General bed mobility comments: Pt didnt follow commands to get up or to lay back down.  PT and daughter physically helped her up and down.  Pt had strength to do it - just wouldnt assist  Transfers Overall transfer level: Needs assistance Equipment used: None Transfers: Sit to/from [employer] Sit to Stand: Mod assist;+2 physical assistance Stand pivot transfers: Mod assist;+2 physical assistance       General  transfer comment: Pt still had her hand mitts on and PT and daughter gave her 2 person hand held/forearm assist  Ambulation/Gait Ambulation/Gait assistance: Min assist;Mod assist;+2 physical assistance Gait Distance (Feet): 150 Feet Assistive device: 2 person hand held assist Gait Pattern/deviations: Trunk flexed;Shuffle;Decreased stride length     General Gait Details: Daughter said yesterday pt was unable to support her wegiht on her feet.  Today pt walked around the halls with 2 person assist - helping her with direction and balance.  Pt never seemed dizzy or fatiqued.  Pt ended up in chair at end of session.  Pt tolerated sitting about 15 min and then daughter unabel to keep her safe in chair and pt assisted back to bed.  Stairs            Wheelchair Mobility    Modified Rankin (Stroke Patients Only)       Balance Overall balance assessment: Needs assistance     Sitting balance - Comments: pt didnt stay sitting EOB or in chair - she would lean posterior or to either side.  she didnt follow directions to stay seated Postural control: Posterior lean;Right lateral lean;Left lateral lean     Standing balance comment: pt stood with 2 person assist only.  Pt able to bear wegiht on her feet but balance not tested today due to altered cognitive status                             Pertinent  Vitals/Pain Pain Assessment: Faces Faces Pain Scale: No hurt Pain Intervention(s): Monitored during session    Home Living Family/patient expects to be discharged to:: Assisted living               Home Equipment: Walker - 4 wheels Additional Comments: pt used rollator at ALF - daughter said for balance    Prior Function Level of Independence: Needs assistance   Gait / Transfers Assistance Needed: pt would move around in her apartment by herself           Hand Dominance        Extremity/Trunk Assessment        Lower Extremity Assessment Lower Extremity  Assessment: Generalized weakness    Cervical / Trunk Assessment Cervical / Trunk Assessment: Kyphotic  Communication   Communication: Receptive difficulties;Expressive difficulties  Cognition Arousal/Alertness: Awake/alert Behavior During Therapy: Agitated;Anxious;Impulsive;Restless Overall Cognitive Status: Impaired/Different from baseline Area of Impairment: Awareness;Attention;Problem solving;Following commands;Safety/judgement                         Safety/Judgement: Decreased awareness of safety;Decreased awareness of deficits     General Comments: pt wanting her hand mits off and trying to pull out her IV throughout therapy.  Pt not following directions.  she was angry and making mean faces at daughter but limited verbal to only an occasional word      General Comments General comments (skin integrity, edema, etc.): IV remained intact during therapy    Exercises     Assessment/Plan    PT Assessment Patient needs continued PT services  PT Problem List Decreased strength;Decreased mobility;Decreased safety awareness;Decreased knowledge of precautions;Decreased activity tolerance;Decreased cognition;Cardiopulmonary status limiting activity;Decreased balance;Decreased knowledge of use of DME       PT Treatment Interventions DME instruction;Therapeutic activities;Cognitive remediation;Gait training;Therapeutic exercise;Patient/family education;Balance training;Functional mobility training    PT Goals (Current goals can be found in the Care Plan section)  Acute Rehab PT Goals Patient Stated Goal: daughter wants pt to return to ALF PT Goal Formulation: With patient/family Time For Goal Achievement: 02/14/20 Potential to Achieve Goals: Good    Frequency Min 3X/week   Barriers to discharge Decreased caregiver support anticipate pt will need 24/7 care at DC    Co-evaluation               AM-PAC PT "6 Clicks" Mobility  Outcome Measure Help needed turning  from your back to your side while in a flat bed without using bedrails?: A Lot Help needed moving from lying on your back to sitting on the side of a flat bed without using bedrails?: A Lot Help needed moving to and from a bed to a chair (including a wheelchair)?: A Lot Help needed standing up from a chair using your arms (e.g., wheelchair or bedside chair)?: A Lot Help needed to walk in hospital room?: A Lot Help needed climbing 3-5 steps with a railing? : Total 6 Click Score: 11    End of Session   Activity Tolerance: Treatment limited secondary to agitation Patient left: in bed;with family/visitor present;with chair alarm set;with call bell/phone within reach Nurse Communication: Precautions PT Visit Diagnosis: Unsteadiness on feet (R26.81);Other symptoms and signs involving the nervous system (R29.898);Other abnormalities of gait and mobility (R26.89);Difficulty in walking, not elsewhere classified (R26.2)    Time: 1045-1110 PT Time Calculation (min) (ACUTE ONLY): 25 min   Charges:   PT Evaluation $PT Eval Low Complexity: 1 Low PT Treatments $Gait Training: 8-22 mins  01/31/2020   Ranae Palms, PT   Judson Roch 01/31/2020, 12:55 PM

## 2020-01-31 NOTE — Progress Notes (Signed)
PROGRESS NOTE    Isabel Zamora  WUJ:[id number] DOB: June 12, 1929 DOA: 01/30/2020 PCP: Patient, No Pcp Per   Brief Narrative:  HPI: Isabel Zamora is a 86 y.o. female with medical history significant of hypertension, hyperlipidemia, dementia, cerebral aneurysm, hypothyroidism, post polio syndrome, kyphosis, presents to emergency department with right-sided weakness and aphasia.  She was last seen normal by facility staff around 5 in the morning.  Patient was noted altered, aphasic and had right-sided weakness this morning noted by staff.  No history of head trauma, seizures, loss of consciousness, chest pain, vomiting, diarrhea, fever, cough, congestion.  At baseline: She walks with the help of a walker, takes shower herself however needs redirection if she has acute short-term memory loss.   She has history of hypertension however her antihypertensive medication was discontinued as her blood pressure was running on lower side.  She has history of hyperlipidemia however not taking any statin due to side effects.  No history of dysuria, hematuria, back pain, nausea, vomiting, fever, chills, lower abdominal pain,  however this morning staff noted that she had urinary incontinence and foul-smelling urine.  She has history of chronic increase urinary frequency at baseline.  No history of smoking, alcohol, street drug use.  ED Course: Upon arrival to ED: Patient afebrile, vital signs stable, maintaining oxygen saturation on room air, sleepy but arousable and sometimes follows commands.  CBC shows leukocytosis of 19.5, BMP shows mild AKI, UDS, ethanol, PT/INR: WNL, UA positive for infection.  COVID-19: Pending.  Patient received aspirin, IV Rocephin, IV fluid in ED.  CT head is concerning for acute lacunar infarction.  CTA head and neck and MRI brain is pending.  Assessment & Plan:   Principal Problem:   Acute metabolic encephalopathy Active Problems:   UTI (urinary tract infection)   AKI (acute  kidney injury) (HCC)   HTN (hypertension)   HLD (hyperlipidemia)   Acute metabolic encephalopathy likely secondary to UTI: -Patient presented with AMS, right-sided weakness and aphasia?  Likely in the setting of UTI vs namenda . MRI brain, CTA head and neck negative for acute findings. UA positive for infection.  No focal deficit on exam.  Moving all extremities spontaneously.  Still quite confused but alert.  According to daughter, she is better than yesterday.  Continue Rocephin and follow culture.  AKI: Resolved.  Continue gentle hydration.  Hypertension: Blood pressure controlled.  Not on any medications at home.  Continue to monitor.  Hyperlipidemia: Total cholesterol 235, LDL 160.  She should be on statins but does not take that due to some sort of side effect which is not clear to me.  Will discuss with her when she is more coherent.   Dementia: Reportedly, she was started on Namenda just few days prior to her change in mental status so family stopped this.  Continue to hold that but continue Aricept, Remeron  Hypothyroidism: TSH normal.  Continue levothyroxine.  DVT prophylaxis: enoxaparin (LOVENOX) injection 40 mg Start: 01/30/20 1900 SCD's Start: 01/30/20 1301   Code Status: DNR  Family Communication:  None present at bedside.  Plan of care discussed with daughter over the phone in detail.  Status is: Inpatient  Remains inpatient appropriate because:Inpatient level of care appropriate due to severity of illness   Dispo: The patient is from: ALF              Anticipated d/c is to: ALF              Anticipated d/c date is:  1 day              Patient currently is not medically stable to d/c.        Estimated body mass index is 19.57 kg/m as calculated from the following:   Height as of 08/11/18: 5\' 2"  (1.575 m).   Weight as of 08/11/18: 48.5 kg.      Nutritional status:               Consultants:   Neurology signed off  Procedures:    None  Antimicrobials:  Anti-infectives (From admission, onward)   Start     Dose/Rate Route Frequency Ordered Stop   01/31/20 1700  cefTRIAXone (ROCEPHIN) 1 g in sodium chloride 0.9 % 100 mL IVPB        1 g 200 mL/hr over 30 Minutes Intravenous Every 24 hours 01/30/20 1325     01/30/20 1300  cefTRIAXone (ROCEPHIN) 1 g in sodium chloride 0.9 % 100 mL IVPB        1 g 200 mL/hr over 30 Minutes Intravenous  Once 01/30/20 1302 01/30/20 1731         Subjective: Seen and examined this morning.  She was alert but quite confused but moving all extremities spontaneously.  Objective: Vitals:   01/31/20 0110 01/31/20 0414 01/31/20 0746 01/31/20 1138  BP: (!) 115/58 (!) 119/57 (!) 114/56 (!) 112/45  Pulse: 76 84 80 71  Resp: 18 15 18 18   Temp: 98.2 F (36.8 C) 98.2 F (36.8 C) 98.2 F (36.8 C) 98 F (36.7 C)  TempSrc: Oral     SpO2: 99% 97% 93%     Intake/Output Summary (Last 24 hours) at 01/31/2020 1300 Last data filed at 01/31/2020 0300 Gross per 24 hour  Intake 1300.51 ml  Output 200 ml  Net 1100.51 ml   There were no vitals filed for this visit.  Examination:  General exam: Appears calm and comfortable  Respiratory system: Clear to auscultation. Respiratory effort normal. Cardiovascular system: S1 & S2 heard, RRR. No JVD, murmurs, rubs, gallops or clicks. No pedal edema. Gastrointestinal system: Abdomen is nondistended, soft and nontender. No organomegaly or masses felt. Normal bowel sounds heard. Central nervous system: Alert and oriented x0. No focal neurological deficits. Extremities: Symmetric 5 x 5 power. Skin: No rashes, lesions or ulcers     Data Reviewed: I have personally reviewed following labs and imaging studies  CBC: Recent Labs  Lab 01/30/20 1107 01/30/20 1112 01/30/20 1620 01/31/20 0407  WBC 19.5*  --  16.7* 12.4*  NEUTROABS 17.1*  --   --   --   HGB 12.9 13.6 12.1 10.4*  HCT 40.4 40.0 37.6 32.2*  MCV 93.5  --  93.5 93.6  PLT 307  --  318  309   Basic Metabolic Panel: Recent Labs  Lab 01/30/20 1107 01/30/20 1112 01/30/20 1530 01/31/20 0407  NA 140 140  --  141  K 4.0 4.1  --  3.5  CL 104 105  --  110  CO2 24  --   --  20*  GLUCOSE 110* 109*  --  91  BUN 17 23  --  16  CREATININE 1.08* 1.00 0.82 0.82  CALCIUM 9.6  --   --  8.7*   GFR: CrCl cannot be calculated (Unknown ideal weight.). Liver Function Tests: Recent Labs  Lab 01/30/20 1107  AST 38  ALT 21  ALKPHOS 82  BILITOT 0.7  PROT 7.2  ALBUMIN 3.8   No results  for input(s): LIPASE, AMYLASE in the last 168 hours. No results for input(s): AMMONIA in the last 168 hours. Coagulation Profile: Recent Labs  Lab 01/30/20 1107  INR 1.0   Cardiac Enzymes: No results for input(s): CKTOTAL, CKMB, CKMBINDEX, TROPONINI in the last 168 hours. BNP (last 3 results) No results for input(s): PROBNP in the last 8760 hours. HbA1C: Recent Labs    01/30/20 1107  HGBA1C 5.6   CBG: Recent Labs  Lab 01/30/20 1108  GLUCAP 100*   Lipid Profile: Recent Labs    01/30/20 1107 01/31/20 0407  CHOL  --  235*  HDL  --  62  LDLCALC  --  387*  TRIG  --  63  CHOLHDL  --  3.8  LDLDIRECT 239.8*  --    Thyroid Function Tests: Recent Labs    01/30/20 1630  TSH 0.599   Anemia Panel: No results for input(s): VITAMINB12, FOLATE, FERRITIN, TIBC, IRON, RETICCTPCT in the last 72 hours. Sepsis Labs: Recent Labs  Lab 01/30/20 1545  LATICACIDVEN 1.6    Recent Results (from the past 240 hour(s))  Respiratory Panel by RT PCR (Flu A&B, Covid) - Nasopharyngeal Swab     Status: None   Collection Time: 01/30/20  3:21 PM   Specimen: Nasopharyngeal Swab  Result Value Ref Range Status   SARS Coronavirus 2 by RT PCR NEGATIVE NEGATIVE Final    Comment: (NOTE) SARS-CoV-2 target nucleic acids are NOT DETECTED.  The SARS-CoV-2 RNA is generally detectable in upper respiratoy specimens during the acute phase of infection. The lowest concentration of SARS-CoV-2 viral copies  this assay can detect is 131 copies/mL. A negative result does not preclude SARS-Cov-2 infection and should not be used as the sole basis for treatment or other patient management decisions. A negative result may occur with  improper specimen collection/handling, submission of specimen other than nasopharyngeal swab, presence of viral mutation(s) within the areas targeted by this assay, and inadequate number of viral copies (<131 copies/mL). A negative result must be combined with clinical observations, patient history, and epidemiological information. The expected result is Negative.  Fact Sheet for Patients:  https://www.moore.com/  Fact Sheet for Healthcare Providers:  https://www.young.biz/  This test is no t yet approved or cleared by the [place] FDA and  has been authorized for detection and/or diagnosis of SARS-CoV-2 by FDA under an Emergency Use Authorization (EUA). This EUA will remain  in effect (meaning this test can be used) for the duration of the COVID-19 declaration under Section 564(b)(1) of the Act, 21 U.S.C. section 360bbb-3(b)(1), unless the authorization is terminated or revoked sooner.     Influenza A by PCR NEGATIVE NEGATIVE Final   Influenza B by PCR NEGATIVE NEGATIVE Final    Comment: (NOTE) The Xpert Xpress SARS-CoV-2/FLU/RSV assay is intended as an aid in  the diagnosis of influenza from Nasopharyngeal swab specimens and  should not be used as a sole basis for treatment. Nasal washings and  aspirates are unacceptable for Xpert Xpress SARS-CoV-2/FLU/RSV  testing.  Fact Sheet for Patients: https://www.moore.com/  Fact Sheet for Healthcare Providers: https://www.young.biz/  This test is not yet approved or cleared by the [place] FDA and  has been authorized for detection and/or diagnosis of SARS-CoV-2 by  FDA under an Emergency Use Authorization (EUA). This EUA  will remain  in effect (meaning this test can be used) for the duration of the  Covid-19 declaration under Section 564(b)(1) of the Act, 21  U.S.C. section 360bbb-3(b)(1), unless the authorization is  terminated or revoked. Performed at [facility], [address] [place], [place] [address]   Culture, blood (routine x 2)     Status: None (Preliminary result)   Collection Time: 01/30/20  4:20 PM   Specimen: BLOOD  Result Value Ref Range Status   Specimen Description BLOOD LEFT ANTECUBITAL  Final   Special Requests   Final    BOTTLES DRAWN AEROBIC AND ANAEROBIC Blood Culture results may not be optimal due to an inadequate volume of blood received in culture bottles   Culture   Final    NO GROWTH < 24 HOURS Performed at [facility], [address], [place], [place] [address]    Report Status PENDING  Incomplete  Culture, blood (routine x 2)     Status: None (Preliminary result)   Collection Time: 01/30/20  4:26 PM   Specimen: BLOOD RIGHT HAND  Result Value Ref Range Status   Specimen Description BLOOD RIGHT HAND  Final   Special Requests   Final    BOTTLES DRAWN AEROBIC AND ANAEROBIC Blood Culture results may not be optimal due to an inadequate volume of blood received in culture bottles   Culture   Final    NO GROWTH < 24 HOURS Performed at [facility], [address] [place], [place] [address]    Report Status PENDING  Incomplete      Radiology Studies: MR BRAIN WO CONTRAST  Result Date: 01/30/2020 CLINICAL DATA:  Neuro deficit, acute stroke suspected. EXAM: MRI HEAD WITHOUT CONTRAST TECHNIQUE: Multiplanar, multiecho pulse sequences of the brain and surrounding structures were obtained without intravenous contrast. COMPARISON:  CT code stroke imaging from the same day. FINDINGS: Brain: No acute infarction, hemorrhage, hydrocephalus, extra-axial collection or mass lesion. Moderate patchy T2/FLAIR hyperintensity within the white matter, compatible with  chronic microvascular ischemic disease. Generalized cerebral atrophy with ex vacuo ventricular dilation. Vascular: Major arterial flow voids are maintained at the skull base. See same day CTA for further vascular evaluation. Skull and upper cervical spine: Normal marrow signal. Sinuses/Orbits: The sinuses are clear. No acute orbital abnormality. Other: No mastoid effusions. IMPRESSION: 1. No acute intracranial abnormality. Specifically, no acute infarct. 2. Moderate chronic microvascular ischemic disease and generalized cerebral atrophy. Electronically Signed   By: Feliberto Harts MD   On: 01/30/2020 16:24   DG Chest Portable 1 View  Result Date: 01/30/2020 CLINICAL DATA:  Right-sided weakness and aphasia. EXAM: PORTABLE CHEST 1 VIEW COMPARISON:  Chest x-ray dated [place] 26, 2020. FINDINGS: The heart size and mediastinal contours are within normal limits. Normal pulmonary vascularity. No focal consolidation, pleural effusion, or pneumothorax. No acute osseous abnormality. Unchanged severe thoracolumbar scoliosis. IMPRESSION: No active disease. Electronically Signed   By: Obie Dredge M.D.   On: 01/30/2020 12:54   CT HEAD CODE STROKE WO CONTRAST  Result Date: 01/30/2020 CLINICAL DATA:  Code stroke. Right-sided weakness, last known well 0500. EXAM: CT HEAD WITHOUT CONTRAST TECHNIQUE: Contiguous axial images were obtained from the base of the skull through the vertex without intravenous contrast. COMPARISON:  Head CT 08/11/2018. FINDINGS: Brain: Mild generalized cerebral atrophy. There is a small lacunar infarct within the posterior limb of left internal capsule which was not definitively present on the prior examination of 08/11/2018 and may be acute (series 3, image 18). Stable background moderate ill-defined hypoattenuation within the cerebral white matter which is nonspecific, but consistent with chronic small vessel ischemic disease. There is no acute intracranial hemorrhage. No demarcated cortical  infarct.  No extra-axial fluid collection. No evidence of intracranial mass. No midline shift. Vascular: No hyperdense vessel. Skull: Normal. Negative for fracture or focal lesion. Sinuses/Orbits: Visualized orbits show no acute finding. No significant paranasal sinus disease or mastoid effusion at the imaged levels. ASPECTS [facility] Stroke Program Early CT Score) - Ganglionic level infarction (caudate, lentiform nuclei, internal capsule, insula, M1-M3 cortex): 6 - Supraganglionic infarction (M4-M6 cortex): 3 Total score (0-10 with 10 being normal): 9 These results were communicated to Dr. Derry Lory At 11:27 amon 10/15/2021by text page via the [facility] messaging system. IMPRESSION: Small lacunar infarct within the posterior limb of left internal capsule which was not definitively present on the prior head CT of 08/11/2018 and which may be acute. ASPECTS is 9. No acute intracranial hemorrhage or acute demarcated cortical infarction. Stable background mild generalized cerebral atrophy and moderate chronic small vessel ischemic disease. Electronically Signed   By: Jackey Loge DO   On: 01/30/2020 11:32   CT ANGIO HEAD CODE STROKE  Result Date: 01/30/2020 CLINICAL DATA:  Stroke/TIA, assess extracranial arteries. Stroke/TIA, assess intracranial arteries. Additional history provided: Suspected left MCA syndrome. EXAM: CT ANGIOGRAPHY HEAD AND NECK TECHNIQUE: Multidetector CT imaging of the head and neck was performed using the standard protocol during bolus administration of intravenous contrast. Multiplanar CT image reconstructions and MIPs were obtained to evaluate the vascular anatomy. Carotid stenosis measurements (when applicable) are obtained utilizing NASCET criteria, using the distal internal carotid diameter as the denominator. CONTRAST:  Administered contrast not known at this time. COMPARISON:  Noncontrast head CT performed immediately prior. FINDINGS: CTA NECK FINDINGS Aortic arch: Common origin of the  innominate and left common carotid arteries atherosclerotic plaque within the visualized aortic arch and proximal major branch vessels of the neck. No hemodynamically significant innominate or proximal subclavian artery stenosis. Right carotid system: CCA and ICA patent within the neck without significant stenosis (50% or greater). Mild soft and calcified plaque within the carotid bifurcation and proximal ICA. Left carotid system: CCA and ICA patent within the neck without significant stenosis (50% or greater). Mild soft and calcified plaque within the carotid bifurcation and proximal ICA. Vertebral arteries: Patent within the neck bilaterally without significant stenosis. The right vertebral artery is subtly dominant Nonstenotic calcified plaque at the origin of the left vertebral artery. Skeleton: Cervicothoracic levocurvature with partially imaged dextrocurvature of the midthoracic spine. Other neck: No neck mass or cervical lymphadenopathy. 7 mm right thyroid lobe nodule not meeting consensus criteria for ultrasound follow-up. Upper chest: Mild atelectasis within the dependent right upper lobe. Review of the MIP images confirms the above findings CTA HEAD FINDINGS Anterior circulation: The intracranial internal carotid arteries are patent. Nonstenotic calcified plaque within both vessels. The M1 middle cerebral arteries are patent without significant stenosis. No M2 proximal branch occlusion is identified. There is a moderate to moderately severe stenosis within a proximal M2 left MCA branch vessel (series 10, image 23). The anterior cerebral arteries are patent. 4 mm laterally projecting aneurysm arising from the distal cavernous right ICA (series 7, image 124) (series 9, image 96). Posterior circulation: The intracranial vertebral arteries are patent. The basilar artery is patent. 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 The posterior cerebral arteries are patent. Moderate stenosis  within the P2 right posterior cerebral artery. Mild atherosclerotic narrowing of the P2 left posterior cerebral artery. Posterior communicating arteries are hypoplastic or absent bilaterally. Venous sinuses: Poorly assessed due to contrast bolus timing. Anatomic variants: As described Review of the MIP images confirms the  above findings No emergent large vessel occlusion. These results were called by telephone at the time of interpretation on 01/30/2020 at 11:40 am to provider [facility] , who verbally acknowledged these results. IMPRESSION: CTA neck: The common carotid, internal carotid and vertebral arteries are patent within the neck without hemodynamically significant stenosis. Mild soft and calcified plaque within the carotid bifurcations and proximal ICAs bilaterally. Nonstenotic calcified plaque at the origin of the non dominant left vertebral artery. CTA head: 1. No intracranial large vessel occlusion. 2. Moderate to moderately severe focal stenosis within a proximal M2 left MCA branch vessel. 3. Moderate focal stenosis within the P2 right posterior cerebral artery. 4. Mild focal stenosis within the P2 left posterior cerebral artery. 5. 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 Electronically Signed   By: Jackey Loge DO   On: 01/30/2020 11:59   CT ANGIO NECK CODE STROKE  Result Date: 01/30/2020 CLINICAL DATA:  Stroke/TIA, assess extracranial arteries. Stroke/TIA, assess intracranial arteries. Additional history provided: Suspected left MCA syndrome. EXAM: CT ANGIOGRAPHY HEAD AND NECK TECHNIQUE: Multidetector CT imaging of the head and neck was performed using the standard protocol during bolus administration of intravenous contrast. Multiplanar CT image reconstructions and MIPs were obtained to evaluate the vascular anatomy. Carotid stenosis measurements (when applicable) are obtained utilizing NASCET criteria, using the distal internal carotid diameter as the denominator.  CONTRAST:  Administered contrast not known at this time. COMPARISON:  Noncontrast head CT performed immediately prior. FINDINGS: CTA NECK FINDINGS Aortic arch: Common origin of the innominate and left common carotid arteries atherosclerotic plaque within the visualized aortic arch and proximal major branch vessels of the neck. No hemodynamically significant innominate or proximal subclavian artery stenosis. Right carotid system: CCA and ICA patent within the neck without significant stenosis (50% or greater). Mild soft and calcified plaque within the carotid bifurcation and proximal ICA. Left carotid system: CCA and ICA patent within the neck without significant stenosis (50% or greater). Mild soft and calcified plaque within the carotid bifurcation and proximal ICA. Vertebral arteries: Patent within the neck bilaterally without significant stenosis. The right vertebral artery is subtly dominant Nonstenotic calcified plaque at the origin of the left vertebral artery. Skeleton: Cervicothoracic levocurvature with partially imaged dextrocurvature of the midthoracic spine. Other neck: No neck mass or cervical lymphadenopathy. 7 mm right thyroid lobe nodule not meeting consensus criteria for ultrasound follow-up. Upper chest: Mild atelectasis within the dependent right upper lobe. Review of the MIP images confirms the above findings CTA HEAD FINDINGS Anterior circulation: The intracranial internal carotid arteries are patent. Nonstenotic calcified plaque within both vessels. The M1 middle cerebral arteries are patent without significant stenosis. No M2 proximal branch occlusion is identified. There is a moderate to moderately severe stenosis within a proximal M2 left MCA branch vessel (series 10, image 23). The anterior cerebral arteries are patent. 4 mm laterally projecting aneurysm arising from the distal cavernous right ICA (series 7, image 124) (series 9, image 96). Posterior circulation: The intracranial vertebral  arteries are patent. The basilar artery is patent. 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 The posterior cerebral arteries are patent. Moderate stenosis within the P2 right posterior cerebral artery. Mild atherosclerotic narrowing of the P2 left posterior cerebral artery. Posterior communicating arteries are hypoplastic or absent bilaterally. Venous sinuses: Poorly assessed due to contrast bolus timing. Anatomic variants: As described Review of the MIP images confirms the above findings No emergent large vessel occlusion. These results were called by telephone at the time  of interpretation on 01/30/2020 at 11:40 am to provider [facility] , who verbally acknowledged these results. IMPRESSION: CTA neck: The common carotid, internal carotid and vertebral arteries are patent within the neck without hemodynamically significant stenosis. Mild soft and calcified plaque within the carotid bifurcations and proximal ICAs bilaterally. Nonstenotic calcified plaque at the origin of the non dominant left vertebral artery. CTA head: 1. No intracranial large vessel occlusion. 2. Moderate to moderately severe focal stenosis within a proximal M2 left MCA branch vessel. 3. Moderate focal stenosis within the P2 right posterior cerebral artery. 4. Mild focal stenosis within the P2 left posterior cerebral artery. 5. 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 Electronically Signed   By: Jackey LogeKyle  Golden DO   On: 01/30/2020 11:59    Scheduled Meds:   stroke: mapping our early stages of recovery book   Does not apply Once   aspirin  81 mg Oral Daily   Or   aspirin  300 mg Rectal Daily   donepezil  10 mg Oral QHS   enoxaparin (LOVENOX) injection  40 mg Subcutaneous Q24H   levothyroxine  25 mcg Oral Q M,W,F   levothyroxine  50 mcg Oral Q T,Th,S,Su   memantine  5 mg Oral Daily   mirtazapine  7.5 mg Oral QHS   Continuous Infusions:  sodium chloride 75 mL/hr at  01/31/20 [id number]   cefTRIAXone (ROCEPHIN)  IV       LOS: 1 day   Time spent: 34 minutes   Hughie Clossavi  [place], MD [facility]  01/31/2020, 1:00 PM   To contact the attending provider between 7A-7P or the covering provider during after hours 7P-7A, please log into the web site [email or web address].

## 2020-02-01 ENCOUNTER — Other Ambulatory Visit: Payer: Self-pay

## 2020-02-01 DIAGNOSIS — I10 Essential (primary) hypertension: Secondary | ICD-10-CM | POA: Diagnosis not present

## 2020-02-01 LAB — CBC WITH DIFFERENTIAL/PLATELET
Abs Immature Granulocytes: 0.05 10*3/uL (ref 0.00–0.07)
Basophils Absolute: 0.1 10*3/uL (ref 0.0–0.1)
Basophils Relative: 0 %
Eosinophils Absolute: 0 10*3/uL (ref 0.0–0.5)
Eosinophils Relative: 0 %
HCT: 33.8 % — ABNORMAL LOW (ref 36.0–46.0)
Hemoglobin: 10.8 g/dL — ABNORMAL LOW (ref 12.0–15.0)
Immature Granulocytes: 0 %
Lymphocytes Relative: 24 %
Lymphs Abs: 2.7 10*3/uL (ref 0.7–4.0)
MCH: 30.3 pg (ref 26.0–34.0)
MCHC: 32 g/dL (ref 30.0–36.0)
MCV: 94.9 fL (ref 80.0–100.0)
Monocytes Absolute: 1.1 10*3/uL — ABNORMAL HIGH (ref 0.1–1.0)
Monocytes Relative: 10 %
Neutro Abs: 7.4 10*3/uL (ref 1.7–7.7)
Neutrophils Relative %: 66 %
Platelets: 275 10*3/uL (ref 150–400)
RBC: 3.56 MIL/uL — ABNORMAL LOW (ref 3.87–5.11)
RDW: 14.5 % (ref 11.5–15.5)
WBC: 11.3 10*3/uL — ABNORMAL HIGH (ref 4.0–10.5)
nRBC: 0 % (ref 0.0–0.2)

## 2020-02-01 LAB — BASIC METABOLIC PANEL
Anion gap: 16 — ABNORMAL HIGH (ref 5–15)
BUN: 9 mg/dL (ref 8–23)
CO2: 18 mmol/L — ABNORMAL LOW (ref 22–32)
Calcium: 8.7 mg/dL — ABNORMAL LOW (ref 8.9–10.3)
Chloride: 107 mmol/L (ref 98–111)
Creatinine, Ser: 0.87 mg/dL (ref 0.44–1.00)
GFR, Estimated: 59 mL/min — ABNORMAL LOW (ref >60)
Glucose, Bld: 80 mg/dL (ref 70–99)
Potassium: 3 mmol/L — ABNORMAL LOW (ref 3.5–5.1)
Sodium: 141 mmol/L (ref 135–145)

## 2020-02-01 LAB — MAGNESIUM: Magnesium: 1.6 mg/dL — ABNORMAL LOW (ref 1.7–2.4)

## 2020-02-01 LAB — GLUCOSE, CAPILLARY: Glucose-Capillary: 107 mg/dL — ABNORMAL HIGH (ref 70–99)

## 2020-02-01 LAB — MRSA PCR SCREENING: MRSA by PCR: NEGATIVE

## 2020-02-01 MED ORDER — POTASSIUM CHLORIDE CRYS ER 20 MEQ PO TBCR
40.0000 meq | EXTENDED_RELEASE_TABLET | ORAL | Status: AC
  Administered 2020-02-01 (×2): 40 meq via ORAL
  Filled 2020-02-01 (×2): qty 2

## 2020-02-01 MED ORDER — QUETIAPINE FUMARATE 25 MG PO TABS
25.0000 mg | ORAL_TABLET | Freq: Every day | ORAL | Status: DC
  Administered 2020-02-01: 25 mg via ORAL
  Filled 2020-02-01: qty 1

## 2020-02-01 MED ORDER — MAGNESIUM SULFATE 2 GM/50ML IV SOLN
2.0000 g | Freq: Once | INTRAVENOUS | Status: AC
  Administered 2020-02-01: 2 g via INTRAVENOUS
  Filled 2020-02-01: qty 50

## 2020-02-01 NOTE — Progress Notes (Signed)
PROGRESS NOTE    Isabel Zamora  GLO:[id number] DOB: 29-Jul-1929 DOA: 01/30/2020 PCP: Patient, No Pcp Per   Brief Narrative:  Isabel Zamora is a 86 y.o. female with medical history significant of hypertension, hyperlipidemia, dementia, cerebral aneurysm, hypothyroidism, post polio syndrome, kyphosis, presented to ED with right-sided weakness and aphasia. She was last seen normal by facility staff around 5 in the morning.  Patient was noted altered, aphasic and had right-sided weakness by staff.  No history of head trauma, seizures, loss of consciousness.  At baseline: She walks with the help of a walker, takes shower herself however needs redirection if she has acute short-term memory loss.  Reportedly she has history of hypertension however her antihypertensive medication was discontinued as her blood pressure was running on lower side and also has history of hyperlipidemia however not taking any statin due to side effects. No history of dysuria, hematuria, back pain, nausea, vomiting, fever, chills, lower abdominal pain,  however staff noted that she had urinary incontinence and foul-smelling urine.  She has history of chronic increase urinary frequency at baseline.  Upon arrival to ED: Patient afebrile, vital signs stable, maintaining oxygen saturation on room air, sleepy but arousable and sometimes follows commands.  CBC shows leukocytosis of 19.5, BMP shows mild AKI, UDS, ethanol, PT/INR: WNL, UA positive for infection.  COVID-19 negative.  Patient received aspirin, IV Rocephin, IV fluid in ED.  CT head is concerning for acute lacunar infarction. She was admitted under hospital service with the and possible seizure.  Neurology consulted.   MRI brain and CTA head and neck negative for acute findings.  Her acute metabolic encephalopathy was attributed to her UTI by neurology so they signed off.  We continued Rocephin.  Assessment & Plan:   Principal Problem:   Acute metabolic encephalopathy Active  Problems:   UTI (urinary tract infection)   AKI (acute kidney injury) (HCC)   HTN (hypertension)   HLD (hyperlipidemia)   Acute metabolic encephalopathy likely secondary to UTI: -Patient presented with AMS, right-sided weakness and aphasia?  Likely in the setting of UTI vs namenda . MRI brain, CTA head and neck negative for acute findings. UA positive for infection.  No focal deficit on exam.  Moving all extremities spontaneously.  Per daughter, she had an eventful night overnight where she was agitated and moving around.  She finally fell asleep around 4 or 5 AM in the morning.  She was sleepy but arousable when I saw her.  She was pleasantly confused.  She may be having some delirium added to her already acute encephalopathy.  We will add Seroquel 25 mg tonight.  We will continue to treat UTI with Rocephin.  Urine culture was ordered by admitting physician but was not sent.  Leukocytosis improving.  She is afebrile.  AKI: Resolved.  Continue gentle hydration.  Hypokalemia: 3.0.  Replace orally.  Recheck in the morning.  Hypomagnesemia: 1.6.  Will replace.  Hypertension: Blood pressure controlled.  Not on any medications at home.  Continue to monitor.  Hyperlipidemia: Total cholesterol 235, LDL 160.  She should be on statins but does not take that due to some sort of side effect which is not clear to me.  Will discuss with her when she is more coherent.   Dementia: Reportedly, she was started on Namenda just few days prior to her change in mental status so family stopped this.  Continue to hold that but continue Aricept, Remeron  Hypothyroidism: TSH normal.  Continue levothyroxine.  DVT  prophylaxis: SCD's Start: 01/30/20 1301   Code Status: DNR  Family Communication: Daughter present at bedside.  Plan of care discussed with her in length.  Status is: Inpatient  Remains inpatient appropriate because:Inpatient level of care appropriate due to severity of illness   Dispo: The  patient is from: ALF              Anticipated d/c is to: ALF              Anticipated d/c date is: 1 day              Patient currently is not medically stable to d/c.        Estimated body mass index is 19.57 kg/m as calculated from the following:   Height as of 08/11/18: 5\' 2"  (1.575 m).   Weight as of 08/11/18: 48.5 kg.      Nutritional status:               Consultants:   Neurology signed off  Procedures:   None  Antimicrobials:  Anti-infectives (From admission, onward)   Start     Dose/Rate Route Frequency Ordered Stop   01/31/20 1700  cefTRIAXone (ROCEPHIN) 1 g in sodium chloride 0.9 % 100 mL IVPB        1 g 200 mL/hr over 30 Minutes Intravenous Every 24 hours 01/30/20 1325     01/30/20 1300  cefTRIAXone (ROCEPHIN) 1 g in sodium chloride 0.9 % 100 mL IVPB        1 g 200 mL/hr over 30 Minutes Intravenous  Once 01/30/20 1302 01/30/20 1731         Subjective: Seen and examined.  Daughter at the bedside.  Patient sleepy but arousable.  When awake, she was pleasantly confused.  Objective: Vitals:   01/31/20 1959 01/31/20 2329 02/01/20 0445 02/01/20 0940  BP: 137/86 (!) 147/76 (!) 162/80 126/76  Pulse: 90 83 94   Resp: 18  18   Temp: 98 F (36.7 C) 98.8 F (37.1 C) 98 F (36.7 C) 98 F (36.7 C)  TempSrc: Oral Oral Oral Oral  SpO2: 98% 97% 96% 98%    Intake/Output Summary (Last 24 hours) at 02/01/2020 1153 Last data filed at 02/01/2020 0800 Gross per 24 hour  Intake 0 ml  Output 600 ml  Net -600 ml   There were no vitals filed for this visit.  Examination:  General exam: Appears calm and comfortable  Respiratory system: Clear to auscultation. Respiratory effort normal. Cardiovascular system: S1 & S2 heard, RRR. No JVD, murmurs, rubs, gallops or clicks. No pedal edema. Gastrointestinal system: Abdomen is nondistended, soft and nontender. No organomegaly or masses felt. Normal bowel sounds heard. Central nervous system: Alert and  oriented x0. No focal neurological deficits. Extremities: Symmetric 5 x 5 power. Skin: No rashes, lesions or ulcers.    Data Reviewed: I have personally reviewed following labs and imaging studies  CBC: Recent Labs  Lab 01/30/20 1107 01/30/20 1112 01/30/20 1620 01/31/20 0407 02/01/20 0217  WBC 19.5*  --  16.7* 12.4* 11.3*  NEUTROABS 17.1*  --   --   --  7.4  HGB 12.9 13.6 12.1 10.4* 10.8*  HCT 40.4 40.0 37.6 32.2* 33.8*  MCV 93.5  --  93.5 93.6 94.9  PLT 307  --  318 309 275   Basic Metabolic Panel: Recent Labs  Lab 01/30/20 1107 01/30/20 1112 01/30/20 1530 01/31/20 0407 02/01/20 0217  NA 140 140  --  141 141  K 4.0 4.1  --  3.5 3.0*  CL 104 105  --  110 107  CO2 24  --   --  20* 18*  GLUCOSE 110* 109*  --  91 80  BUN 17 23  --  16 9  CREATININE 1.08* 1.00 0.82 0.82 0.87  CALCIUM 9.6  --   --  8.7* 8.7*  MG  --   --   --   --  1.6*   GFR: CrCl cannot be calculated (Unknown ideal weight.). Liver Function Tests: Recent Labs  Lab 01/30/20 1107  AST 38  ALT 21  ALKPHOS 82  BILITOT 0.7  PROT 7.2  ALBUMIN 3.8   No results for input(s): LIPASE, AMYLASE in the last 168 hours. No results for input(s): AMMONIA in the last 168 hours. Coagulation Profile: Recent Labs  Lab 01/30/20 1107  INR 1.0   Cardiac Enzymes: No results for input(s): CKTOTAL, CKMB, CKMBINDEX, TROPONINI in the last 168 hours. BNP (last 3 results) No results for input(s): PROBNP in the last 8760 hours. HbA1C: Recent Labs    01/30/20 1107  HGBA1C 5.6   CBG: Recent Labs  Lab 01/30/20 1108  GLUCAP 100*   Lipid Profile: Recent Labs    01/30/20 1107 01/31/20 0407  CHOL  --  235*  HDL  --  62  LDLCALC  --  161*  TRIG  --  63  CHOLHDL  --  3.8  LDLDIRECT 239.8*  --    Thyroid Function Tests: Recent Labs    01/30/20 1630  TSH 0.599   Anemia Panel: No results for input(s): VITAMINB12, FOLATE, FERRITIN, TIBC, IRON, RETICCTPCT in the last 72 hours. Sepsis Labs: Recent Labs    Lab 01/30/20 1545  LATICACIDVEN 1.6    Recent Results (from the past 240 hour(s))  Respiratory Panel by RT PCR (Flu A&B, Covid) - Nasopharyngeal Swab     Status: None   Collection Time: 01/30/20  3:21 PM   Specimen: Nasopharyngeal Swab  Result Value Ref Range Status   SARS Coronavirus 2 by RT PCR NEGATIVE NEGATIVE Final    Comment: (NOTE) SARS-CoV-2 target nucleic acids are NOT DETECTED.  The SARS-CoV-2 RNA is generally detectable in upper respiratoy specimens during the acute phase of infection. The lowest concentration of SARS-CoV-2 viral copies this assay can detect is 131 copies/mL. A negative result does not preclude SARS-Cov-2 infection and should not be used as the sole basis for treatment or other patient management decisions. A negative result may occur with  improper specimen collection/handling, submission of specimen other than nasopharyngeal swab, presence of viral mutation(s) within the areas targeted by this assay, and inadequate number of viral copies (<131 copies/mL). A negative result must be combined with clinical observations, patient history, and epidemiological information. The expected result is Negative.  Fact Sheet for Patients:  https://www.moore.com/  Fact Sheet for Healthcare Providers:  https://www.young.biz/  This test is no t yet approved or cleared by the [place] FDA and  has been authorized for detection and/or diagnosis of SARS-CoV-2 by FDA under an Emergency Use Authorization (EUA). This EUA will remain  in effect (meaning this test can be used) for the duration of the COVID-19 declaration under Section 564(b)(1) of the Act, 21 U.S.C. section 360bbb-3(b)(1), unless the authorization is terminated or revoked sooner.     Influenza A by PCR NEGATIVE NEGATIVE Final   Influenza B by PCR NEGATIVE NEGATIVE Final    Comment: (NOTE) The Xpert Xpress SARS-CoV-2/FLU/RSV assay is intended as  an aid in   the diagnosis of influenza from Nasopharyngeal swab specimens and  should not be used as a sole basis for treatment. Nasal washings and  aspirates are unacceptable for Xpert Xpress SARS-CoV-2/FLU/RSV  testing.  Fact Sheet for Patients: https://www.moore.com/https://www.fda.gov/media/142436/download  Fact Sheet for Healthcare Providers: https://www.young.biz/https://www.fda.gov/media/142435/download  This test is not yet approved or cleared by the [place] FDA and  has been authorized for detection and/or diagnosis of SARS-CoV-2 by  FDA under an Emergency Use Authorization (EUA). This EUA will remain  in effect (meaning this test can be used) for the duration of the  Covid-19 declaration under Section 564(b)(1) of the Act, 21  U.S.C. section 360bbb-3(b)(1), unless the authorization is  terminated or revoked. Performed at [facility], [address] [place], [place] [address]   Culture, blood (routine x 2)     Status: None (Preliminary result)   Collection Time: 01/30/20  4:20 PM   Specimen: BLOOD  Result Value Ref Range Status   Specimen Description BLOOD LEFT ANTECUBITAL  Final   Special Requests   Final    BOTTLES DRAWN AEROBIC AND ANAEROBIC Blood Culture results may not be optimal due to an inadequate volume of blood received in culture bottles   Culture   Final    NO GROWTH 2 DAYS Performed at [facility], [address] [place], [place] [address]    Report Status PENDING  Incomplete  Culture, blood (routine x 2)     Status: None (Preliminary result)   Collection Time: 01/30/20  4:26 PM   Specimen: BLOOD RIGHT HAND  Result Value Ref Range Status   Specimen Description BLOOD RIGHT HAND  Final   Special Requests   Final    BOTTLES DRAWN AEROBIC AND ANAEROBIC Blood Culture results may not be optimal due to an inadequate volume of blood received in culture bottles   Culture   Final    NO GROWTH 2 DAYS Performed at [facility], [address] [place], [place] [address]    Report Status  PENDING  Incomplete      Radiology Studies: MR BRAIN WO CONTRAST  Result Date: 01/30/2020 CLINICAL DATA:  Neuro deficit, acute stroke suspected. EXAM: MRI HEAD WITHOUT CONTRAST TECHNIQUE: Multiplanar, multiecho pulse sequences of the brain and surrounding structures were obtained without intravenous contrast. COMPARISON:  CT code stroke imaging from the same day. FINDINGS: Brain: No acute infarction, hemorrhage, hydrocephalus, extra-axial collection or mass lesion. Moderate patchy T2/FLAIR hyperintensity within the white matter, compatible with chronic microvascular ischemic disease. Generalized cerebral atrophy with ex vacuo ventricular dilation. Vascular: Major arterial flow voids are maintained at the skull base. See same day CTA for further vascular evaluation. Skull and upper cervical spine: Normal marrow signal. Sinuses/Orbits: The sinuses are clear. No acute orbital abnormality. Other: No mastoid effusions. IMPRESSION: 1. No acute intracranial abnormality. Specifically, no acute infarct. 2. Moderate chronic microvascular ischemic disease and generalized cerebral atrophy. Electronically Signed   By: Feliberto HartsFrederick S Jones MD   On: 01/30/2020 16:24   DG Chest Portable 1 View  Result Date: 01/30/2020 CLINICAL DATA:  Right-sided weakness and aphasia. EXAM: PORTABLE CHEST 1 VIEW COMPARISON:  Chest x-ray dated Mardie 26, 2020. FINDINGS: The heart size and mediastinal contours are within normal limits. Normal pulmonary vascularity. No focal consolidation, pleural effusion, or pneumothorax. No acute osseous abnormality. Unchanged severe thoracolumbar scoliosis. IMPRESSION: No active disease. Electronically Signed   By: Obie DredgeWilliam T Derry M.D.   On: 01/30/2020 12:54  Scheduled Meds: .  stroke: mapping our early stages of recovery book   Does not apply Once  . aspirin  81 mg Oral Daily   Or  . aspirin  300 mg Rectal Daily  . donepezil  10 mg Oral QHS  . levothyroxine  25 mcg Oral Q M,W,F  .  levothyroxine  50 mcg Oral Q T,Th,S,Su  . memantine  5 mg Oral Daily  . mirtazapine  7.5 mg Oral QHS  . potassium chloride  40 mEq Oral Q4H   Continuous Infusions: . sodium chloride 75 mL/hr at 02/01/20 1149  . cefTRIAXone (ROCEPHIN)  IV 1 g (01/31/20 1636)     LOS: 2 days   Time spent: 30 minutes   Hughie Closs, MD [facility]  02/01/2020, 11:53 AM   To contact the attending provider between 7A-7P or the covering provider during after hours 7P-7A, please log into the web site [email or web address].

## 2020-02-01 NOTE — Evaluation (Signed)
Occupational Therapy Evaluation Patient Details Name: Isabel Zamora MRN: [id number] DOB: 10-13-1929 Today's Date: 02/01/2020    History of Present Illness Pt is an 86 yo female presenting with R sided weakness and aphasia with concern for L gaze preference. MRI and CXR negative for acute findings. Differential dx includes UTI. PMH: dementia (poor STM/intact LTM per MD note), hypothyroidism, HTN, HLD, cerebral aneurysm, post polio syndrome, kyphosis   Clinical Impression   Pt admitted with above. She demonstrates the below listed deficits and will benefit from continued OT to maximize safety and independence with BADLs.  Pt presents to OT with significant cognitive deficits, impaired balance, decreased activity tolerance as well as expressive communication deficits.  She currently requires max - total A, overall for simple ADLs due to impaired attention, agitation, and inability to consistently follow one step commands.  She required max A for bed mobility, and mod A for sit to stand.  Per chart review, she resides at [facility].   Due to her h/o dementia, optimally a return to [facility] would be in her best interest from a cognitive standpoint as she is familiar with the environment and the staff; however, I am unsure if ALF will be able to provide the amount of assistance she currently requires.   IF they are able to provide this level of care, or is she improves enough to return to ALF, that would be optimal with HHOT; If not, she will require SNF level rehab. Will follow acutely.       Follow Up Recommendations  SNF (IF ALF able to provide current level of care, recommend return to ALF with HHOT)    Equipment Recommendations  None recommended by OT    Recommendations for Other Services       Precautions / Restrictions Precautions Precautions: Fall Precaution Comments: Pt with significant confusion and agitation       Mobility Bed Mobility Overal bed mobility: Needs  Assistance Bed Mobility: Supine to Sit;Sit to Supine     Supine to sit: Max assist Sit to supine: Max assist   General bed mobility comments: with max multi modal cues, she initiated moving to EOB, but unable to complete the task, once assist provided, she was able to assist minimally  Transfers Overall transfer level: Needs assistance Equipment used: 1 person hand held assist Transfers: Sit to/from Stand Sit to Stand: Mod assist Stand pivot transfers: Mod assist       General transfer comment: Pt required mod A and max multimodal cues to stand.  She was able to sidestep up EOB when assist provided to weighshift     Balance Overall balance assessment: Needs assistance Sitting-balance support: Feet supported;Bilateral upper extremity supported Sitting balance-Leahy Scale: Poor Sitting balance - Comments: requires UE support  Postural control: Posterior lean   Standing balance-Leahy Scale: Poor Standing balance comment: requires mod A for static standing                            ADL either performed or assessed with clinical judgement   ADL Overall ADL's : Needs assistance/impaired Eating/Feeding: Moderate assistance;Bed level   Grooming: Wash/dry face;Wash/dry hands;Oral care;Maximal assistance;Bed level;Sitting Grooming Details (indicate cue type and reason): Pt difficult to engage in activity due to impaired attention and difficulty following commands  Upper Body Bathing: Total assistance;Sitting;Bed level   Lower Body Bathing: Total assistance;Bed level;Sit to/from stand   Upper Body Dressing : Total assistance;Sitting;Bed level   Lower Body  Dressing: Total assistance;Sit to/from stand;Bed level   Toilet Transfer: Moderate assistance;Stand-pivot;BSC   Toileting- Clothing Manipulation and Hygiene: Total assistance;Sit to/from stand       Functional mobility during ADLs: Moderate assistance General ADL Comments: Pt requires assist due to severity of  cognitive deficits and agitation.      Vision   Additional Comments: unable to asses accurately due to her diffculty sustaining attention and following commands. She was able to read 6 words as part of the NIHSS      Perception Perception Perception Tested?: No   Praxis Praxis Praxis tested?: Not tested    Pertinent Vitals/Pain Pain Assessment: Faces Faces Pain Scale: No hurt     Hand Dominance  (unsure )   Extremity/Trunk Assessment Upper Extremity Assessment Upper Extremity Assessment: Generalized weakness   Lower Extremity Assessment Lower Extremity Assessment: Generalized weakness   Cervical / Trunk Assessment Cervical / Trunk Assessment: Kyphotic   Communication Communication Communication: Receptive difficulties   Cognition Arousal/Alertness: Awake/alert;Lethargic Behavior During Therapy: Anxious;Impulsive;Restless;Agitated Overall Cognitive Status: Impaired/Different from baseline Area of Impairment: Attention;Following commands;Safety/judgement;Memory                   Current Attention Level: Focused Memory: Decreased short-term memory Following Commands: Follows one step commands inconsistently;Follows one step commands with increased time Safety/Judgement: Decreased awareness of safety;Decreased awareness of deficits     General Comments: Pt angry that she has mittens on.  She perseverates on her name, and that "you nurses all say that you understand", "Don't you understand I can't hold the phone"   With max mulimodal cues, she is able to follow one step motor commands inconsistently    General Comments       Exercises     Shoulder Instructions      Home Living Family/patient expects to be discharged to:: Assisted living                             Home Equipment: Walker - 4 wheels   Additional Comments: pt used rollator at ALF - daughter said for balance      Prior Functioning/Environment Level of Independence: Needs  assistance  Gait / Transfers Assistance Needed: pt would move around in her apartment by herself ADL's / Homemaking Assistance Needed: no family presnt to provide info             OT Problem List: Decreased strength;Decreased activity tolerance;Impaired balance (sitting and/or standing);Decreased cognition;Decreased safety awareness;Decreased knowledge of use of DME or AE      OT Treatment/Interventions: Self-care/ADL training;DME and/or AE instruction;Therapeutic activities;Cognitive remediation/compensation;Patient/family education;Balance training    OT Goals(Current goals can be found in the care plan section) Acute Rehab OT Goals OT Goal Formulation: Patient unable to participate in goal setting Time For Goal Achievement: 02/15/20 Potential to Achieve Goals: Good ADL Goals Pt Will Perform Eating: with supervision;with set-up;sitting Pt Will Perform Grooming: with min assist;standing Pt Will Perform Upper Body Bathing: with min assist;sitting Pt Will Perform Lower Body Bathing: with mod assist;sit to/from stand Pt Will Perform Upper Body Dressing: with mod assist;sitting Pt Will Perform Lower Body Dressing: with mod assist;sit to/from stand Pt Will Transfer to Toilet: with min assist;ambulating;regular height toilet;grab bars Pt Will Perform Toileting - Clothing Manipulation and hygiene: with mod assist;sit to/from stand Additional ADL Goal #1: Pt will sustain attention to familiar ADL tasks x min cues x 3 mins  OT Frequency: Min 2X/week   Barriers to D/C:  Co-evaluation              AM-PAC OT "6 Clicks" Daily Activity     Outcome Measure Help from another person eating meals?: A Lot Help from another person taking care of personal grooming?: A Lot Help from another person toileting, which includes using toliet, bedpan, or urinal?: A Lot Help from another person bathing (including washing, rinsing, drying)?: Total Help from another person to put on and  taking off regular upper body clothing?: Total Help from another person to put on and taking off regular lower body clothing?: Total 6 Click Score: 9   End of Session Nurse Communication: Mobility status  Activity Tolerance: Treatment limited secondary to agitation Patient left: in bed;with call bell/phone within reach;with bed alarm set;with restraints reapplied  OT Visit Diagnosis: Unsteadiness on feet (R26.81);Cognitive communication deficit (R41.841)                Time: 3790-2409 OT Time Calculation (min): 13 min Charges:  OT General Charges $OT Visit: 1 Visit OT Evaluation $OT Eval Moderate Complexity: 1 Mod  Eber Jones., OTR/L Acute Rehabilitation Services Pager [phone] Office [phone]   Jeani Hawking M 02/01/2020, 3:17 PM

## 2020-02-02 DIAGNOSIS — I10 Essential (primary) hypertension: Secondary | ICD-10-CM | POA: Diagnosis not present

## 2020-02-02 LAB — BASIC METABOLIC PANEL
Anion gap: 13 (ref 5–15)
BUN: 5 mg/dL — ABNORMAL LOW (ref 8–23)
CO2: 17 mmol/L — ABNORMAL LOW (ref 22–32)
Calcium: 8.7 mg/dL — ABNORMAL LOW (ref 8.9–10.3)
Chloride: 109 mmol/L (ref 98–111)
Creatinine, Ser: 0.8 mg/dL (ref 0.44–1.00)
GFR, Estimated: >60 mL/min (ref >60)
Glucose, Bld: 87 mg/dL (ref 70–99)
Potassium: 4 mmol/L (ref 3.5–5.1)
Sodium: 139 mmol/L (ref 135–145)

## 2020-02-02 LAB — CBC WITH DIFFERENTIAL/PLATELET
Abs Immature Granulocytes: 0.02 10*3/uL (ref 0.00–0.07)
Basophils Absolute: 0 10*3/uL (ref 0.0–0.1)
Basophils Relative: 0 %
Eosinophils Absolute: 0.2 10*3/uL (ref 0.0–0.5)
Eosinophils Relative: 2 %
HCT: 35.2 % — ABNORMAL LOW (ref 36.0–46.0)
Hemoglobin: 11.5 g/dL — ABNORMAL LOW (ref 12.0–15.0)
Immature Granulocytes: 0 %
Lymphocytes Relative: 17 %
Lymphs Abs: 1.6 10*3/uL (ref 0.7–4.0)
MCH: 30.5 pg (ref 26.0–34.0)
MCHC: 32.7 g/dL (ref 30.0–36.0)
MCV: 93.4 fL (ref 80.0–100.0)
Monocytes Absolute: 0.8 10*3/uL (ref 0.1–1.0)
Monocytes Relative: 9 %
Neutro Abs: 6.7 10*3/uL (ref 1.7–7.7)
Neutrophils Relative %: 72 %
Platelets: 272 10*3/uL (ref 150–400)
RBC: 3.77 MIL/uL — ABNORMAL LOW (ref 3.87–5.11)
RDW: 14.5 % (ref 11.5–15.5)
WBC: 9.4 10*3/uL (ref 4.0–10.5)
nRBC: 0 % (ref 0.0–0.2)

## 2020-02-02 LAB — HEMOGLOBIN A1C
Hgb A1c MFr Bld: 5.4 % (ref 4.8–5.6)
Mean Plasma Glucose: 108 mg/dL

## 2020-02-02 MED ORDER — ACETAMINOPHEN 500 MG PO TABS: 500.0000 mg | ORAL_TABLET | Freq: Four times a day (QID) | ORAL | 0 refills | Status: AC | PRN

## 2020-02-02 MED ORDER — MULTIVITAMIN ADULT PO CHEW: 1.0000 | CHEWABLE_TABLET | Freq: Every morning | ORAL | 0 refills | Status: AC

## 2020-02-02 NOTE — Discharge Summary (Addendum)
Physician Discharge Summary  Isabel Zamora EHM:[id number] DOB: 31-May-1929 DOA: 01/30/2020  PCP: Patient, No Pcp Per  Admit date: 01/30/2020 Discharge date: 02/02/2020  Admitted From: ALF Disposition: ALF  Recommendations for Outpatient Follow-up:  1. Follow up with PCP in 1-2 weeks 2. Please obtain BMP/CBC in one week 3. Please follow up with your PCP on the following pending results: Unresulted Labs (From admission, onward)          Start     Ordered   01/30/20 1302  Urine culture  ONCE - STAT,   STAT        01/30/20 1302           Home Health: Yes Equipment/Devices: None  Discharge Condition: Stable CODE STATUS: DNR Diet recommendation: Low-sodium  Subjective: Seen and examined.  Definitely more alert.  Oriented x1-2.  Daughter at the bedside.  No complaints.  Brief/Interim Summary: Isabel Pooleis a 86 y.o.femalewith medical history significant ofhypertension, hyperlipidemia, dementia,cerebral aneurysm,hypothyroidism, post polio syndrome, kyphosis, presented to ED with right-sided weakness and aphasia. She was last seen normal by facility staff around 5 in the morning.Patient was noted altered, aphasic and had right-sided weakness by staff. No history of head trauma, seizures, loss of consciousness. At baseline: She walkswith the help of a walker, takes shower herselfhowever needs redirection if she has acute short-term memory loss.  Reportedly she has history of hypertension however her antihypertensive medication was discontinued as her blood pressure was running on lower side and also has history of hyperlipidemia however not taking any statin due to side effects. No history of dysuria, hematuria, back pain, nausea, vomiting, fever, chills, lower abdominal pain, however staff noted that she had urinary incontinence and foul-smelling urine. She has history of chronic increase urinary frequencyat baseline.  Upon arrival to ED: Patient afebrile, vital signs stable,  maintaining oxygen saturation on room air, sleepy but arousable and sometimes follows commands. CBC shows leukocytosis of 19.5, BMP shows mild AKI, UDS, ethanol, PT/INR: WNL, UA positive for infection. COVID-19 negative. Patient received aspirin, IV Rocephin, IV fluid in ED. CT head was concerning for acute lacunar infarction.   She was admitted under hospital service. Neurology consulted.  MRI brain and CTA head and neck negative for acute findings.  Her acute metabolic encephalopathy was attributed to her UTI by neurology so they signed off.  We continued Rocephin.  Of note, she was also started on Namenda lately and she took 1-2 doses before she had acute metabolic encephalopathy.  Namenda can cause confusion in 6% of the patients.  This was discontinued per daughters request rightfully so.  Urine culture was ordered by admitting physician but for some reason, it was never sent.  Patient then also had some component of hospital-acquired delirium in her acute encephalopathy.  She was given Seroquel last night which resulted in good sleep and this morning patient is much more alert and pleasant and oriented x2.  Per daughter who is at the bedside, patient is much better and very close to her baseline and daughter is willing to take her back to assisted living facility.  PT OT recommended discharging to SNF but daughter preferred ALF.  Case manager confirmed with the ALF that they can provide help/therapies/services that patient requires.  Patient is being discharged in stable condition.  She received 3 doses of Rocephin here.  She does not need any further antibiotics.  She is being discharged in a stable condition.  Discharge Diagnoses:  Principal Problem:   Acute metabolic encephalopathy Active  Problems:   UTI (urinary tract infection)   AKI (acute kidney injury) (HCC)   HTN (hypertension)   HLD (hyperlipidemia)    Discharge Instructions  Discharge Instructions    Ambulatory referral to  Occupational Therapy   Complete by: As directed    Ambulatory referral to Physical Therapy   Complete by: As directed      Allergies as of 02/02/2020      Reactions   Morphine And Related    Penicillins       Medication List    STOP taking these medications   memantine 5 MG tablet Commonly known as: NAMENDA   multivitamin tablet     TAKE these medications   acetaminophen 500 MG tablet Commonly known as: TYLENOL Take 1 tablet (500 mg total) by mouth every 6 (six) hours as needed for mild pain or fever. What changed:   medication strength  how much to take  reasons to take this   aspirin EC 81 MG tablet Take 81 mg by mouth daily. Swallow whole.   cholecalciferol 25 MCG (1000 UNIT) tablet Commonly known as: VITAMIN D3 Take 1,000 Units by mouth daily.   donepezil 10 MG tablet Commonly known as: ARICEPT Take 10 mg by mouth at bedtime.   levothyroxine 50 MCG tablet Commonly known as: SYNTHROID Take 50 mcg by mouth every Tuesday, Thursday, Saturday, and Sunday.   levothyroxine 25 MCG tablet Commonly known as: SYNTHROID Take 25 mcg by mouth every Monday, Wednesday, and Friday.   mirtazapine 7.5 MG tablet Commonly known as: REMERON Take 7.5 mg by mouth at bedtime.   Multivitamin Adult Chew Chew 1 tablet by mouth in the morning.       Allergies  Allergen Reactions  . Morphine And Related   . Penicillins     Consultations: Neurology   Procedures/Studies: MR BRAIN WO CONTRAST  Result Date: 01/30/2020 CLINICAL DATA:  Neuro deficit, acute stroke suspected. EXAM: MRI HEAD WITHOUT CONTRAST TECHNIQUE: Multiplanar, multiecho pulse sequences of the brain and surrounding structures were obtained without intravenous contrast. COMPARISON:  CT code stroke imaging from the same day. FINDINGS: Brain: No acute infarction, hemorrhage, hydrocephalus, extra-axial collection or mass lesion. Moderate patchy T2/FLAIR hyperintensity within the white matter, compatible with  chronic microvascular ischemic disease. Generalized cerebral atrophy with ex vacuo ventricular dilation. Vascular: Major arterial flow voids are maintained at the skull base. See same day CTA for further vascular evaluation. Skull and upper cervical spine: Normal marrow signal. Sinuses/Orbits: The sinuses are clear. No acute orbital abnormality. Other: No mastoid effusions. IMPRESSION: 1. No acute intracranial abnormality. Specifically, no acute infarct. 2. Moderate chronic microvascular ischemic disease and generalized cerebral atrophy. Electronically Signed   By: Feliberto Harts MD   On: 01/30/2020 16:24   DG Chest Portable 1 View  Result Date: 01/30/2020 CLINICAL DATA:  Right-sided weakness and aphasia. EXAM: PORTABLE CHEST 1 VIEW COMPARISON:  Chest x-ray dated Mariyana 26, 2020. FINDINGS: The heart size and mediastinal contours are within normal limits. Normal pulmonary vascularity. No focal consolidation, pleural effusion, or pneumothorax. No acute osseous abnormality. Unchanged severe thoracolumbar scoliosis. IMPRESSION: No active disease. Electronically Signed   By: Obie Dredge M.D.   On: 01/30/2020 12:54   CT HEAD CODE STROKE WO CONTRAST  Result Date: 01/30/2020 CLINICAL DATA:  Code stroke. Right-sided weakness, last known well 0500. EXAM: CT HEAD WITHOUT CONTRAST TECHNIQUE: Contiguous axial images were obtained from the base of the skull through the vertex without intravenous contrast. COMPARISON:  Head CT 08/11/2018. FINDINGS:  Brain: Mild generalized cerebral atrophy. There is a small lacunar infarct within the posterior limb of left internal capsule which was not definitively present on the prior examination of 08/11/2018 and may be acute (series 3, image 18). Stable background moderate ill-defined hypoattenuation within the cerebral white matter which is nonspecific, but consistent with chronic small vessel ischemic disease. There is no acute intracranial hemorrhage. No demarcated cortical  infarct. No extra-axial fluid collection. No evidence of intracranial mass. No midline shift. Vascular: No hyperdense vessel. Skull: Normal. Negative for fracture or focal lesion. Sinuses/Orbits: Visualized orbits show no acute finding. No significant paranasal sinus disease or mastoid effusion at the imaged levels. ASPECTS [facility] Stroke Program Early CT Score) - Ganglionic level infarction (caudate, lentiform nuclei, internal capsule, insula, M1-M3 cortex): 6 - Supraganglionic infarction (M4-M6 cortex): 3 Total score (0-10 with 10 being normal): 9 These results were communicated to Dr. Derry Lory At 11:27 amon 10/15/2021by text page via the [facility] messaging system. IMPRESSION: Small lacunar infarct within the posterior limb of left internal capsule which was not definitively present on the prior head CT of 08/11/2018 and which may be acute. ASPECTS is 9. No acute intracranial hemorrhage or acute demarcated cortical infarction. Stable background mild generalized cerebral atrophy and moderate chronic small vessel ischemic disease. Electronically Signed   By: Jackey Loge DO   On: 01/30/2020 11:32   CT ANGIO HEAD CODE STROKE  Addendum Date: 02/02/2020   ADDENDUM REPORT: 02/02/2020 08:14 ADDENDUM: Findings omitted from the original impression: 4 mm laterally projecting saccular aneurysm arising from the distal cavernous right ICA. These results will be called to the ordering clinician or representative by the Radiologist Assistant, and communication documented in the PACS or [employer]. Electronically Signed   By: Jackey Loge DO   On: 02/02/2020 08:14   Result Date: 02/02/2020 CLINICAL DATA:  Stroke/TIA, assess extracranial arteries. Stroke/TIA, assess intracranial arteries. Additional history provided: Suspected left MCA syndrome. EXAM: CT ANGIOGRAPHY HEAD AND NECK TECHNIQUE: Multidetector CT imaging of the head and neck was performed using the standard protocol during bolus administration of  intravenous contrast. Multiplanar CT image reconstructions and MIPs were obtained to evaluate the vascular anatomy. Carotid stenosis measurements (when applicable) are obtained utilizing NASCET criteria, using the distal internal carotid diameter as the denominator. CONTRAST:  Administered contrast not known at this time. COMPARISON:  Noncontrast head CT performed immediately prior. FINDINGS: CTA NECK FINDINGS Aortic arch: Common origin of the innominate and left common carotid arteries atherosclerotic plaque within the visualized aortic arch and proximal major branch vessels of the neck. No hemodynamically significant innominate or proximal subclavian artery stenosis. Right carotid system: CCA and ICA patent within the neck without significant stenosis (50% or greater). Mild soft and calcified plaque within the carotid bifurcation and proximal ICA. Left carotid system: CCA and ICA patent within the neck without significant stenosis (50% or greater). Mild soft and calcified plaque within the carotid bifurcation and proximal ICA. Vertebral arteries: Patent within the neck bilaterally without significant stenosis. The right vertebral artery is subtly dominant Nonstenotic calcified plaque at the origin of the left vertebral artery. Skeleton: Cervicothoracic levocurvature with partially imaged dextrocurvature of the midthoracic spine. Other neck: No neck mass or cervical lymphadenopathy. 7 mm right thyroid lobe nodule not meeting consensus criteria for ultrasound follow-up. Upper chest: Mild atelectasis within the dependent right upper lobe. Review of the MIP images confirms the above findings CTA HEAD FINDINGS Anterior circulation: The intracranial internal carotid arteries are patent. Nonstenotic calcified plaque within both  vessels. The M1 middle cerebral arteries are patent without significant stenosis. No M2 proximal branch occlusion is identified. There is a moderate to moderately severe stenosis within a proximal  M2 left MCA branch vessel (series 10, image 23). The anterior cerebral arteries are patent. 4 mm laterally projecting aneurysm arising from the distal cavernous right ICA (series 7, image 124) (series 9, image 96). Posterior circulation: The intracranial vertebral arteries are patent. The basilar artery is patent. 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 The posterior cerebral arteries are patent. Moderate stenosis within the P2 right posterior cerebral artery. Mild atherosclerotic narrowing of the P2 left posterior cerebral artery. Posterior communicating arteries are hypoplastic or absent bilaterally. Venous sinuses: Poorly assessed due to contrast bolus timing. Anatomic variants: As described Review of the MIP images confirms the above findings No emergent large vessel occlusion. These results were called by telephone at the time of interpretation on 01/30/2020 at 11:40 am to provider [facility] , who verbally acknowledged these results. IMPRESSION: CTA neck: The common carotid, internal carotid and vertebral arteries are patent within the neck without hemodynamically significant stenosis. Mild soft and calcified plaque within the carotid bifurcations and proximal ICAs bilaterally. Nonstenotic calcified plaque at the origin of the non dominant left vertebral artery. CTA head: 1. No intracranial large vessel occlusion. 2. Moderate to moderately severe focal stenosis within a proximal M2 left MCA branch vessel. 3. Moderate focal stenosis within the P2 right posterior cerebral artery. 4. Mild focal stenosis within the P2 left posterior cerebral artery. 5. 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 Electronically Signed: By: Jackey Loge DO On: 01/30/2020 11:59   CT ANGIO NECK CODE STROKE  Addendum Date: 02/02/2020   ADDENDUM REPORT: 02/02/2020 08:14 ADDENDUM: Findings omitted from the original impression: 4 mm laterally projecting saccular aneurysm arising  from the distal cavernous right ICA. These results will be called to the ordering clinician or representative by the Radiologist Assistant, and communication documented in the PACS or [employer]. Electronically Signed   By: Jackey Loge DO   On: 02/02/2020 08:14   Result Date: 02/02/2020 CLINICAL DATA:  Stroke/TIA, assess extracranial arteries. Stroke/TIA, assess intracranial arteries. Additional history provided: Suspected left MCA syndrome. EXAM: CT ANGIOGRAPHY HEAD AND NECK TECHNIQUE: Multidetector CT imaging of the head and neck was performed using the standard protocol during bolus administration of intravenous contrast. Multiplanar CT image reconstructions and MIPs were obtained to evaluate the vascular anatomy. Carotid stenosis measurements (when applicable) are obtained utilizing NASCET criteria, using the distal internal carotid diameter as the denominator. CONTRAST:  Administered contrast not known at this time. COMPARISON:  Noncontrast head CT performed immediately prior. FINDINGS: CTA NECK FINDINGS Aortic arch: Common origin of the innominate and left common carotid arteries atherosclerotic plaque within the visualized aortic arch and proximal major branch vessels of the neck. No hemodynamically significant innominate or proximal subclavian artery stenosis. Right carotid system: CCA and ICA patent within the neck without significant stenosis (50% or greater). Mild soft and calcified plaque within the carotid bifurcation and proximal ICA. Left carotid system: CCA and ICA patent within the neck without significant stenosis (50% or greater). Mild soft and calcified plaque within the carotid bifurcation and proximal ICA. Vertebral arteries: Patent within the neck bilaterally without significant stenosis. The right vertebral artery is subtly dominant Nonstenotic calcified plaque at the origin of the left vertebral artery. Skeleton: Cervicothoracic levocurvature with partially imaged dextrocurvature  of the midthoracic spine. Other neck: No neck mass or cervical  lymphadenopathy. 7 mm right thyroid lobe nodule not meeting consensus criteria for ultrasound follow-up. Upper chest: Mild atelectasis within the dependent right upper lobe. Review of the MIP images confirms the above findings CTA HEAD FINDINGS Anterior circulation: The intracranial internal carotid arteries are patent. Nonstenotic calcified plaque within both vessels. The M1 middle cerebral arteries are patent without significant stenosis. No M2 proximal branch occlusion is identified. There is a moderate to moderately severe stenosis within a proximal M2 left MCA branch vessel (series 10, image 23). The anterior cerebral arteries are patent. 4 mm laterally projecting aneurysm arising from the distal cavernous right ICA (series 7, image 124) (series 9, image 96). Posterior circulation: The intracranial vertebral arteries are patent. The basilar artery is patent. 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 The posterior cerebral arteries are patent. Moderate stenosis within the P2 right posterior cerebral artery. Mild atherosclerotic narrowing of the P2 left posterior cerebral artery. Posterior communicating arteries are hypoplastic or absent bilaterally. Venous sinuses: Poorly assessed due to contrast bolus timing. Anatomic variants: As described Review of the MIP images confirms the above findings No emergent large vessel occlusion. These results were called by telephone at the time of interpretation on 01/30/2020 at 11:40 am to provider [facility] , who verbally acknowledged these results. IMPRESSION: CTA neck: The common carotid, internal carotid and vertebral arteries are patent within the neck without hemodynamically significant stenosis. Mild soft and calcified plaque within the carotid bifurcations and proximal ICAs bilaterally. Nonstenotic calcified plaque at the origin of the non dominant left vertebral artery.  CTA head: 1. No intracranial large vessel occlusion. 2. Moderate to moderately severe focal stenosis within a proximal M2 left MCA branch vessel. 3. Moderate focal stenosis within the P2 right posterior cerebral artery. 4. Mild focal stenosis within the P2 left posterior cerebral artery. 5. 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 Electronically Signed: By: Jackey Loge DO On: 01/30/2020 11:59     Discharge Exam: Vitals:   02/02/20 0733 02/02/20 1136  BP: (!) 161/78 129/75  Pulse: 89 89  Resp: 18   Temp: 97.9 F (36.6 C) 98 F (36.7 C)  SpO2: 97% 96%   Vitals:   02/02/20 0038 02/02/20 0417 02/02/20 0733 02/02/20 1136  BP: (!) 160/73 114/69 (!) 161/78 129/75  Pulse: 90 80 89 89  Resp: 19  18   Temp: 98.6 F (37 C) 97.7 F (36.5 C) 97.9 F (36.6 C) 98 F (36.7 C)  TempSrc: Oral Oral Oral Oral  SpO2: 96% 95% 97% 96%    General: Pt is alert, awake, not in acute distress Cardiovascular: RRR, S1/S2 +, no rubs, no gallops Respiratory: CTA bilaterally, no wheezing, no rhonchi Abdominal: Soft, NT, ND, bowel sounds + Extremities: no edema, no cyanosis    The results of significant diagnostics from this hospitalization (including imaging, microbiology, ancillary and laboratory) are listed below for reference.     Microbiology: Recent Results (from the past 240 hour(s))  Respiratory Panel by RT PCR (Flu A&B, Covid) - Nasopharyngeal Swab     Status: None   Collection Time: 01/30/20  3:21 PM   Specimen: Nasopharyngeal Swab  Result Value Ref Range Status   SARS Coronavirus 2 by RT PCR NEGATIVE NEGATIVE Final    Comment: (NOTE) SARS-CoV-2 target nucleic acids are NOT DETECTED.  The SARS-CoV-2 RNA is generally detectable in upper respiratoy specimens during the acute phase of infection. The lowest concentration of SARS-CoV-2 viral copies this assay can detect is 131 copies/mL. A  negative result does not preclude SARS-Cov-2 infection and should not be used  as the sole basis for treatment or other patient management decisions. A negative result may occur with  improper specimen collection/handling, submission of specimen other than nasopharyngeal swab, presence of viral mutation(s) within the areas targeted by this assay, and inadequate number of viral copies (<131 copies/mL). A negative result must be combined with clinical observations, patient history, and epidemiological information. The expected result is Negative.  Fact Sheet for Patients:  https://www.moore.com/  Fact Sheet for Healthcare Providers:  https://www.young.biz/  This test is no t yet approved or cleared by the [place] FDA and  has been authorized for detection and/or diagnosis of SARS-CoV-2 by FDA under an Emergency Use Authorization (EUA). This EUA will remain  in effect (meaning this test can be used) for the duration of the COVID-19 declaration under Section 564(b)(1) of the Act, 21 U.S.C. section 360bbb-3(b)(1), unless the authorization is terminated or revoked sooner.     Influenza A by PCR NEGATIVE NEGATIVE Final   Influenza B by PCR NEGATIVE NEGATIVE Final    Comment: (NOTE) The Xpert Xpress SARS-CoV-2/FLU/RSV assay is intended as an aid in  the diagnosis of influenza from Nasopharyngeal swab specimens and  should not be used as a sole basis for treatment. Nasal washings and  aspirates are unacceptable for Xpert Xpress SARS-CoV-2/FLU/RSV  testing.  Fact Sheet for Patients: https://www.moore.com/  Fact Sheet for Healthcare Providers: https://www.young.biz/  This test is not yet approved or cleared by the [place] FDA and  has been authorized for detection and/or diagnosis of SARS-CoV-2 by  FDA under an Emergency Use Authorization (EUA). This EUA will remain  in effect (meaning this test can be used) for the duration of the  Covid-19 declaration under Section  564(b)(1) of the Act, 21  U.S.C. section 360bbb-3(b)(1), unless the authorization is  terminated or revoked. Performed at [facility], [address] [place], [place] [address]   Culture, blood (routine x 2)     Status: None (Preliminary result)   Collection Time: 01/30/20  4:20 PM   Specimen: BLOOD  Result Value Ref Range Status   Specimen Description BLOOD LEFT ANTECUBITAL  Final   Special Requests   Final    BOTTLES DRAWN AEROBIC AND ANAEROBIC Blood Culture results may not be optimal due to an inadequate volume of blood received in culture bottles   Culture   Final    NO GROWTH 3 DAYS Performed at [facility], [address], [place], [place] [address]    Report Status PENDING  Incomplete  Culture, blood (routine x 2)     Status: None (Preliminary result)   Collection Time: 01/30/20  4:26 PM   Specimen: BLOOD RIGHT HAND  Result Value Ref Range Status   Specimen Description BLOOD RIGHT HAND  Final   Special Requests   Final    BOTTLES DRAWN AEROBIC AND ANAEROBIC Blood Culture results may not be optimal due to an inadequate volume of blood received in culture bottles   Culture   Final    NO GROWTH 3 DAYS Performed at [facility], [address], [place], [place] [address]    Report Status PENDING  Incomplete  MRSA PCR Screening     Status: None   Collection Time: 02/01/20  8:21 PM   Specimen: Nasal Mucosa; Nasopharyngeal  Result Value Ref Range Status   MRSA by PCR NEGATIVE NEGATIVE Final    Comment:  The GeneXpert MRSA Assay (FDA approved for NASAL specimens only), is one component of a comprehensive MRSA colonization surveillance program. It is not intended to diagnose MRSA infection nor to guide or monitor treatment for MRSA infections. Performed at [facility], [address] [place], [place] [address]      Labs: BNP (last 3 results) No results for input(s): BNP in the last 8760 hours. Basic Metabolic Panel: Recent Labs   Lab 01/30/20 1107 01/30/20 1107 01/30/20 1112 01/30/20 1530 01/31/20 0407 02/01/20 0217 02/02/20 0825  NA 140  --  140  --  141 141 139  K 4.0  --  4.1  --  3.5 3.0* 4.0  CL 104  --  105  --  110 107 109  CO2 24  --   --   --  20* 18* 17*  GLUCOSE 110*  --  109*  --  91 80 87  BUN 17  --  23  --  16 9 5*  CREATININE 1.08*   < > 1.00 0.82 0.82 0.87 0.80  CALCIUM 9.6  --   --   --  8.7* 8.7* 8.7*  MG  --   --   --   --   --  1.6*  --    < > = values in this interval not displayed.   Liver Function Tests: Recent Labs  Lab 01/30/20 1107  AST 38  ALT 21  ALKPHOS 82  BILITOT 0.7  PROT 7.2  ALBUMIN 3.8   No results for input(s): LIPASE, AMYLASE in the last 168 hours. No results for input(s): AMMONIA in the last 168 hours. CBC: Recent Labs  Lab 01/30/20 1107 01/30/20 1107 01/30/20 1112 01/30/20 1620 01/31/20 0407 02/01/20 0217 02/02/20 0825  WBC 19.5*  --   --  16.7* 12.4* 11.3* 9.4  NEUTROABS 17.1*  --   --   --   --  7.4 6.7  HGB 12.9   < > 13.6 12.1 10.4* 10.8* 11.5*  HCT 40.4   < > 40.0 37.6 32.2* 33.8* 35.2*  MCV 93.5  --   --  93.5 93.6 94.9 93.4  PLT 307  --   --  318 309 275 272   < > = values in this interval not displayed.   Cardiac Enzymes: No results for input(s): CKTOTAL, CKMB, CKMBINDEX, TROPONINI in the last 168 hours. BNP: Invalid input(s): POCBNP CBG: Recent Labs  Lab 01/30/20 1108 02/01/20 1229  GLUCAP 100* 107*   D-Dimer No results for input(s): DDIMER in the last 72 hours. Hgb A1c Recent Labs    01/31/20 0407  HGBA1C 5.4   Lipid Profile Recent Labs    01/31/20 0407  CHOL 235*  HDL 62  LDLCALC 160*  TRIG 63  CHOLHDL 3.8   Thyroid function studies Recent Labs    01/30/20 1630  TSH 0.599   Anemia work up No results for input(s): VITAMINB12, FOLATE, FERRITIN, TIBC, IRON, RETICCTPCT in the last 72 hours. Urinalysis    Component Value Date/Time   COLORURINE AMBER (A) 01/30/2020 1155   APPEARANCEUR TURBID (A) 01/30/2020  1155   LABSPEC >1.046 (H) 01/30/2020 1155   PHURINE 5.0 01/30/2020 1155   GLUCOSEU 150 (A) 01/30/2020 1155   HGBUR SMALL (A) 01/30/2020 1155   BILIRUBINUR NEGATIVE 01/30/2020 1155   KETONESUR 5 (A) 01/30/2020 1155   PROTEINUR 30 (A) 01/30/2020 1155   NITRITE POSITIVE (A) 01/30/2020 1155   LEUKOCYTESUR LARGE (A) 01/30/2020 1155   Sepsis Labs  Invalid input(s): PROCALCITONIN,  WBC,  LACTICIDVEN Microbiology Recent Results (from the past 240 hour(s))  Respiratory Panel by RT PCR (Flu A&B, Covid) - Nasopharyngeal Swab     Status: None   Collection Time: 01/30/20  3:21 PM   Specimen: Nasopharyngeal Swab  Result Value Ref Range Status   SARS Coronavirus 2 by RT PCR NEGATIVE NEGATIVE Final    Comment: (NOTE) SARS-CoV-2 target nucleic acids are NOT DETECTED.  The SARS-CoV-2 RNA is generally detectable in upper respiratoy specimens during the acute phase of infection. The lowest concentration of SARS-CoV-2 viral copies this assay can detect is 131 copies/mL. A negative result does not preclude SARS-Cov-2 infection and should not be used as the sole basis for treatment or other patient management decisions. A negative result may occur with  improper specimen collection/handling, submission of specimen other than nasopharyngeal swab, presence of viral mutation(s) within the areas targeted by this assay, and inadequate number of viral copies (<131 copies/mL). A negative result must be combined with clinical observations, patient history, and epidemiological information. The expected result is Negative.  Fact Sheet for Patients:  https://www.moore.com/  Fact Sheet for Healthcare Providers:  https://www.young.biz/  This test is no t yet approved or cleared by the [place] FDA and  has been authorized for detection and/or diagnosis of SARS-CoV-2 by FDA under an Emergency Use Authorization (EUA). This EUA will remain  in effect (meaning this test  can be used) for the duration of the COVID-19 declaration under Section 564(b)(1) of the Act, 21 U.S.C. section 360bbb-3(b)(1), unless the authorization is terminated or revoked sooner.     Influenza A by PCR NEGATIVE NEGATIVE Final   Influenza B by PCR NEGATIVE NEGATIVE Final    Comment: (NOTE) The Xpert Xpress SARS-CoV-2/FLU/RSV assay is intended as an aid in  the diagnosis of influenza from Nasopharyngeal swab specimens and  should not be used as a sole basis for treatment. Nasal washings and  aspirates are unacceptable for Xpert Xpress SARS-CoV-2/FLU/RSV  testing.  Fact Sheet for Patients: https://www.moore.com/  Fact Sheet for Healthcare Providers: https://www.young.biz/  This test is not yet approved or cleared by the [place] FDA and  has been authorized for detection and/or diagnosis of SARS-CoV-2 by  FDA under an Emergency Use Authorization (EUA). This EUA will remain  in effect (meaning this test can be used) for the duration of the  Covid-19 declaration under Section 564(b)(1) of the Act, 21  U.S.C. section 360bbb-3(b)(1), unless the authorization is  terminated or revoked. Performed at [facility], [address], [place], [place] [address]   Culture, blood (routine x 2)     Status: None (Preliminary result)   Collection Time: 01/30/20  4:20 PM   Specimen: BLOOD  Result Value Ref Range Status   Specimen Description BLOOD LEFT ANTECUBITAL  Final   Special Requests   Final    BOTTLES DRAWN AEROBIC AND ANAEROBIC Blood Culture results may not be optimal due to an inadequate volume of blood received in culture bottles   Culture   Final    NO GROWTH 3 DAYS Performed at [facility], [address], [place], [place] [address]    Report Status PENDING  Incomplete  Culture, blood (routine x 2)     Status: None (Preliminary result)   Collection Time: 01/30/20  4:26 PM   Specimen: BLOOD RIGHT HAND  Result Value Ref  Range Status   Specimen Description BLOOD RIGHT HAND  Final   Special Requests  Final    BOTTLES DRAWN AEROBIC AND ANAEROBIC Blood Culture results may not be optimal due to an inadequate volume of blood received in culture bottles   Culture   Final    NO GROWTH 3 DAYS Performed at [facility], [address] [place], [place] [address]    Report Status PENDING  Incomplete  MRSA PCR Screening     Status: None   Collection Time: 02/01/20  8:21 PM   Specimen: Nasal Mucosa; Nasopharyngeal  Result Value Ref Range Status   MRSA by PCR NEGATIVE NEGATIVE Final    Comment:        The GeneXpert MRSA Assay (FDA approved for NASAL specimens only), is one component of a comprehensive MRSA colonization surveillance program. It is not intended to diagnose MRSA infection nor to guide or monitor treatment for MRSA infections. Performed at [facility], [address] [place], [place] [address]      Time coordinating discharge: Over 30 minutes  SIGNED:   Hughie Clossavi Cj Edgell, MD  [facility] 02/02/2020, 1:49 PM  If 7PM-7AM, please contact night-coverage [email or web address]

## 2020-02-02 NOTE — Progress Notes (Signed)
Patients daughter given Discharge instructions by RN. Called [facility] two times to attempt to give report but received no answer. IV removed. CCMD notified patient leaving floor via wheelchair.

## 2020-02-02 NOTE — [place] FL2 (Addendum)
[place] MEDICAID FL2 LEVEL OF CARE SCREENING TOOL     IDENTIFICATION  Patient Name: Isabel Zamora Birthdate: 1929-09-19 Sex: female Admission Date (Current Location): 01/30/2020  [facility] and [place] Number:  Producer, television/film/video and Address:  [facility], [address] [place], [place] [address]      Provider Number: [id number]  Attending Physician Name and Address:  Hughie Closs, MD  Relative Name and Phone Number:       Current Level of Care: Hospital Recommended Level of Care: Assisted Living Facility Prior Approval Number:    Date Approved/Denied:   PASRR Number:    Discharge Plan: Other (Comment) (Assisted Living)    Current Diagnoses: Patient Active Problem List   Diagnosis Date Noted  . UTI (urinary tract infection) 01/30/2020  . AKI (acute kidney injury) (HCC) 01/30/2020  . HTN (hypertension) 01/30/2020  . HLD (hyperlipidemia) 01/30/2020  . Acute metabolic encephalopathy 01/30/2020    Orientation RESPIRATION BLADDER Height & Weight     Self  Normal Incontinent Weight:   Height:     BEHAVIORAL SYMPTOMS/MOOD NEUROLOGICAL BOWEL NUTRITION STATUS      Incontinent Diet (regular)  AMBULATORY STATUS COMMUNICATION OF NEEDS Skin   Extensive Assist Verbally Normal                       Personal Care Assistance Level of Assistance  Bathing, Feeding, Dressing Bathing Assistance: Maximum assistance Feeding assistance: Independent Dressing Assistance: Maximum assistance     Functional Limitations Info             SPECIAL CARE FACTORS FREQUENCY                       Contractures Contractures Info: Not present    Additional Factors Info  Code Status, Allergies Code Status Info: DNR Allergies Info: Morphine and Related, Penicillins           Current Medications (02/02/2020):  This is the current hospital active medication list Current Facility-Administered Medications  Medication Dose Route Frequency  Provider Last Rate Last Admin  .  stroke: mapping our early stages of recovery book   Does not apply Once Pahwani, Rinka R, MD      . 0.9 %  sodium chloride infusion   Intravenous Continuous Pahwani, Rinka R, MD 75 mL/hr at 02/02/20 0313 New Bag at 02/02/20 0313  . acetaminophen (TYLENOL) tablet 650 mg  650 mg Oral Q4H PRN Pahwani, Rinka R, MD       Or  . acetaminophen (TYLENOL) 160 MG/5ML solution 650 mg  650 mg Per Tube Q4H PRN Pahwani, Rinka R, MD       Or  . acetaminophen (TYLENOL) suppository 650 mg  650 mg Rectal Q4H PRN Pahwani, Rinka R, MD      . aspirin chewable tablet 81 mg  81 mg Oral Daily Erick Blinks, MD   81 mg at 02/02/20 1058   Or  . aspirin suppository 300 mg  300 mg Rectal Daily Erick Blinks, MD   300 mg at 01/30/20 1143  . cefTRIAXone (ROCEPHIN) 1 g in sodium chloride 0.9 % 100 mL IVPB  1 g Intravenous Q24H Pahwani, Rinka R, MD 200 mL/hr at 02/01/20 1813 1 g at 02/01/20 1813  . donepezil (ARICEPT) tablet 10 mg  10 mg Oral QHS Pahwani, Rinka R, MD   10 mg at 02/01/20 2152  . haloperidol lactate (HALDOL) injection 5 mg  5 mg Intravenous  Q6H PRN Hughie Closs, MD   5 mg at 02/01/20 0347  . levothyroxine (SYNTHROID) tablet 25 mcg  25 mcg Oral Q M,W,F Pahwani, Rinka R, MD   25 mcg at 02/02/20 0700  . levothyroxine (SYNTHROID) tablet 50 mcg  50 mcg Oral Q T,Th,S,Su Pahwani, Rinka R, MD      . mirtazapine (REMERON) tablet 7.5 mg  7.5 mg Oral QHS Pahwani, Rinka R, MD   7.5 mg at 02/01/20 2152  . QUEtiapine (SEROQUEL) tablet 25 mg  25 mg Oral QHS Hughie Closs, MD   25 mg at 02/01/20 2152     Discharge Medications: STOP taking these medications   memantine 5 MG tablet Commonly known as: NAMENDA   multivitamin tablet     TAKE these medications   acetaminophen 500 MG tablet Commonly known as: TYLENOL Take 1 tablet (500 mg total) by mouth every 6 (six) hours as needed for mild pain or fever. What changed:   medication strength  how much to take  reasons  to take this   aspirin EC 81 MG tablet Take 81 mg by mouth daily. Swallow whole.   cholecalciferol 25 MCG (1000 UNIT) tablet Commonly known as: VITAMIN D3 Take 1,000 Units by mouth daily.   donepezil 10 MG tablet Commonly known as: ARICEPT Take 10 mg by mouth at bedtime.   levothyroxine 50 MCG tablet Commonly known as: SYNTHROID Take 50 mcg by mouth every Tuesday, Thursday, Saturday, and Sunday.   levothyroxine 25 MCG tablet Commonly known as: SYNTHROID Take 25 mcg by mouth every Monday, Wednesday, and Friday.   mirtazapine 7.5 MG tablet Commonly known as: REMERON Take 7.5 mg by mouth at bedtime.   Multivitamin Adult Chew Chew 1 tablet by mouth in the morning.    .  Relevant Imaging Results:  Relevant Lab Results:   Additional Information    Chana Bode, Student-Social Work

## 2020-02-02 NOTE — Progress Notes (Signed)
  Speech Language Pathology Treatment: Dysphagia  Patient Details Name: Isabel Zamora MRN: [id number] DOB: 1929/10/05 Today's Date: 02/02/2020 Time: 5361-4431 SLP Time Calculation (min) (ACUTE ONLY): 15 min  Assessment / Plan / Recommendation Clinical Impression  Skilled observation complete with self feeding of regular solids and thin liquids. Patient presents with a normal oropharyngeal swallow with appropriate oral transit, swift appearing swallow initiation, and no overt indication of aspiration. Daughter present and reports no concerns. No further SLP needs indicated. Defer cognitive-linguistic evaluation at this time as MRI negative for CVA and per daughter, patient is returning to baseline.     HPI HPI: Pt is an 86 yo female presenting with R sided weakness and aphasia with concern for L gaze preference. MRI and CXR negative for acute findings. Differential dx includes UTI. PMH: dementia (poor STM/intact LTM per MD note), hypothyroidism, HTN, HLD, cerebral aneurysm, post polio syndrome, kyphosis      SLP Plan  All goals met;Discharge SLP treatment due to (comment)       Recommendations  Diet recommendations: Regular;Thin liquid Liquids provided via: Cup;Straw Medication Administration: Whole meds with liquid Supervision: Patient able to self feed Compensations: Slow rate;Small sips/bites Postural Changes and/or Swallow Maneuvers: Seated upright 90 degrees                Oral Care Recommendations: Oral care BID Follow up Recommendations: None SLP Visit Diagnosis: Dysphagia, unspecified (R13.10) Plan: All goals met;Discharge SLP treatment due to (comment)       GO              Isabel Rainwater MA, CCC-SLP      Isabel Zamora 02/02/2020, 9:59 AM

## 2020-02-02 NOTE — Discharge Instructions (Signed)
 Hospital Discharge After a Stroke  Being discharged from the hospital after a stroke can feel overwhelming. Many things may be different, and it is normal to feel scared or anxious. Some stroke survivors may be able to return to their homes, and others may need more specialized care on a temporary or permanent basis. Your stroke care team will work with you to develop a discharge plan that is best for you. Ask questions if you do not understand something. Invite a friend or family member to participate in discharge planning. Understanding and following your discharge plan can help to prevent another stroke or other problems. Understanding your medicines After a stroke, your health care provider may prescribe one or more types of medicine. It is important to take medicines exactly as told by your health care provider. Serious harm, such as another stroke, can happen if you are unable to take your medicine exactly as prescribed. Make sure you understand:  What medicine to take.  Why you are taking the medicine.  How and when to take it.  If it can be taken with your other medicines and herbal supplements.  Possible side effects.  When to call your health care provider if you have any side effects.  How you will get and pay for your medicines. Medical assistance programs may be able to help you pay for prescription medicines if you cannot afford them. If you are taking an anticoagulant, be sure to take it exactly as told by your health care provider. This type of medicine can increase the risk of bleeding because it works to prevent blood from clotting. You may need to take certain precautions to prevent bleeding. You should contact your health care provider if you have:  Bleeding or bruising.  A fall or other injury to your head.  Blood in your urine or stool (feces). Planning for home safety  Take steps to prevent falls, such as installing grab bars or using a shower chair. Ask a  friend or family member to get needed things in place before you go home if possible. A therapist can come to your home to make recommendations for safety equipment. Ask your health care provider if you would benefit from this service or from home care. Getting needed equipment Ask your health care provider for a list of any medical equipment and supplies you will need at home. These may include items such as:  Walkers.  Canes.  Wheelchairs.  Hand-strengthening devices.  Special eating utensils. Medical equipment can be rented or purchased, depending on your insurance coverage. Check with your insurance company about what is covered. Keeping follow-up visits After a stroke, you will need to follow up regularly with a health care provider. You may also need rehabilitation, which can include physical therapy, occupational therapy, or speech-language therapy. Keeping these appointments is very important to your recovery after a stroke. Be sure to bring your medicine list and discharge papers with you to your appointments. If you need help to keep track of your schedule, use a calendar or appointment reminder. Preventing another stroke Having a stroke puts you at risk for another stroke in the future. Ask your health care provider what actions you can take to lower the risk. These may include:  Increasing how much you exercise.  Making a healthy eating plan.  Quitting smoking.  Managing other health conditions, such as high blood pressure, high cholesterol, or diabetes.  Limiting alcohol use. Knowing the warning signs of a stroke  Make   sure you understand the signs of a stroke. Before you leave the hospital, you will receive information outlining the stroke warning signs. Share these with your friends and family members. "BE FAST" is an easy way to remember the main warning signs of a stroke:  B - Balance. Signs are dizziness, sudden trouble walking, or loss of balance.  E - Eyes.  Signs are trouble seeing or a sudden change in vision.  F - Face. Signs are sudden weakness or numbness of the face, or the face or eyelid drooping on one side.  A - Arms. Signs are weakness or numbness in an arm. This happens suddenly and usually on one side of the body.  S - Speech. Signs are sudden trouble speaking, slurred speech, or trouble understanding what people say.  T - Time. Time to call emergency services. Write down what time symptoms started. Other signs of stroke may include:  A sudden, severe headache with no known cause.  Nausea or vomiting.  Seizure. These symptoms may represent a serious problem that is an emergency. Do not wait to see if the symptoms will go away. Get medical help right away. Call your local emergency services (911 in the [place] Do not drive yourself to the hospital. Make note of the time that you had your first symptoms. Your emergency responders or emergency room staff will need to know this information. Summary  Being discharged from the hospital after a stroke can feel overwhelming. It is normal to feel scared or anxious.  Make sure you take medicines exactly as told by your health care provider.  Know the warning signs of a stroke, and get help right way if you have any of these symptoms. "BE FAST" is an easy way to remember the main warning signs of a stroke. This information is not intended to replace advice given to you by your health care provider. Make sure you discuss any questions you have with your health care provider. Document Revised: 12/25/2018 Document Reviewed: 07/07/2016 Elsevier Patient Education  2020 Elsevier Inc.  

## 2020-02-02 NOTE — Care Management Important Message (Signed)
Important Message  Patient Details  Name: Isabel Zamora MRN: [id number] Date of Birth: [birth year]   Medicare Important Message Given:  Yes     [name] 02/02/2020, 3:01 PM

## 2020-02-02 NOTE — TOC Transition Note (Signed)
Transition of Care [facility]) - CM/SW Discharge Note   Patient Details  Name: Isabel Zamora MRN: [id number] Date of Birth: 05-27-1929  [facility] CM/SW Contact:  Chana Bode, Student-Social Work Phone Number: 02/02/2020, 2:10 PM   Clinical Narrative:   Nurse to call report to [phone]    Final next level of care: Assisted Living Barriers to Discharge: Barriers Resolved   Patient Goals and CMS Choice        Discharge Placement              Patient chooses bed at: [facility] Patient to be transferred to facility by: Daughter Name of family member notified: Ryver Patient and family notified of of transfer: 02/02/20  Discharge Plan and Services                                     Social Determinants of Health (SDOH) Interventions     Readmission Risk Interventions No flowsheet data found.

## 2020-02-03 ENCOUNTER — Encounter: Payer: Self-pay | Admitting: Physician Assistant

## 2020-02-03 ENCOUNTER — Other Ambulatory Visit: Payer: Self-pay | Admitting: Physician Assistant

## 2020-02-03 ENCOUNTER — Other Ambulatory Visit: Payer: Self-pay

## 2020-02-03 ENCOUNTER — Emergency Department (HOSPITAL_COMMUNITY): Payer: Medicare Other

## 2020-02-03 ENCOUNTER — Emergency Department (HOSPITAL_COMMUNITY)
Admission: EM | Admit: 2020-02-03 | Discharge: 2020-02-03 | Disposition: A | Discharge status: Home / Self Care | Payer: Medicare Other | Attending: Emergency Medicine | Admitting: Emergency Medicine

## 2020-02-03 ENCOUNTER — Encounter (HOSPITAL_COMMUNITY): Payer: Self-pay | Admitting: Emergency Medicine

## 2020-02-03 DIAGNOSIS — S0000XA Unspecified superficial injury of scalp, initial encounter: Secondary | ICD-10-CM | POA: Diagnosis present

## 2020-02-03 DIAGNOSIS — I10 Essential (primary) hypertension: Secondary | ICD-10-CM | POA: Insufficient documentation

## 2020-02-03 DIAGNOSIS — R9431 Abnormal electrocardiogram [ECG] [EKG]: Secondary | ICD-10-CM

## 2020-02-03 DIAGNOSIS — F0390 Unspecified dementia without behavioral disturbance: Secondary | ICD-10-CM | POA: Insufficient documentation

## 2020-02-03 DIAGNOSIS — Z79899 Other long term (current) drug therapy: Secondary | ICD-10-CM | POA: Insufficient documentation

## 2020-02-03 DIAGNOSIS — S0101XA Laceration without foreign body of scalp, initial encounter: Secondary | ICD-10-CM | POA: Insufficient documentation

## 2020-02-03 DIAGNOSIS — Z515 Encounter for palliative care: Secondary | ICD-10-CM | POA: Insufficient documentation

## 2020-02-03 DIAGNOSIS — Z7982 Long term (current) use of aspirin: Secondary | ICD-10-CM | POA: Diagnosis not present

## 2020-02-03 DIAGNOSIS — R778 Other specified abnormalities of plasma proteins: Secondary | ICD-10-CM

## 2020-02-03 DIAGNOSIS — E039 Hypothyroidism, unspecified: Secondary | ICD-10-CM | POA: Diagnosis not present

## 2020-02-03 DIAGNOSIS — W19XXXA Unspecified fall, initial encounter: Secondary | ICD-10-CM | POA: Diagnosis not present

## 2020-02-03 DIAGNOSIS — R748 Abnormal levels of other serum enzymes: Secondary | ICD-10-CM | POA: Insufficient documentation

## 2020-02-03 DIAGNOSIS — Y92009 Unspecified place in unspecified non-institutional (private) residence as the place of occurrence of the external cause: Secondary | ICD-10-CM | POA: Diagnosis not present

## 2020-02-03 DIAGNOSIS — E785 Hyperlipidemia, unspecified: Secondary | ICD-10-CM | POA: Diagnosis present

## 2020-02-03 DIAGNOSIS — W1830XA Fall on same level, unspecified, initial encounter: Secondary | ICD-10-CM | POA: Insufficient documentation

## 2020-02-03 DIAGNOSIS — I213 ST elevation (STEMI) myocardial infarction of unspecified site: Secondary | ICD-10-CM | POA: Diagnosis present

## 2020-02-03 DIAGNOSIS — Z7189 Other specified counseling: Secondary | ICD-10-CM

## 2020-02-03 LAB — COMPREHENSIVE METABOLIC PANEL
ALT: 26 U/L (ref 0–44)
AST: 51 U/L — ABNORMAL HIGH (ref 15–41)
Albumin: 2.9 g/dL — ABNORMAL LOW (ref 3.5–5.0)
Alkaline Phosphatase: 64 U/L (ref 38–126)
Anion gap: 9 (ref 5–15)
BUN: 6 mg/dL — ABNORMAL LOW (ref 8–23)
CO2: 24 mmol/L (ref 22–32)
Calcium: 9.4 mg/dL (ref 8.9–10.3)
Chloride: 107 mmol/L (ref 98–111)
Creatinine, Ser: 0.67 mg/dL (ref 0.44–1.00)
GFR, Estimated: >60 mL/min (ref >60)
Glucose, Bld: 112 mg/dL — ABNORMAL HIGH (ref 70–99)
Potassium: 3.4 mmol/L — ABNORMAL LOW (ref 3.5–5.1)
Sodium: 140 mmol/L (ref 135–145)
Total Bilirubin: 0.9 mg/dL (ref 0.3–1.2)
Total Protein: 6.4 g/dL — ABNORMAL LOW (ref 6.5–8.1)

## 2020-02-03 LAB — URINALYSIS, ROUTINE W REFLEX MICROSCOPIC
Bilirubin Urine: NEGATIVE
Glucose, UA: NEGATIVE mg/dL
Ketones, ur: 20 mg/dL — AB
Leukocytes,Ua: NEGATIVE
Nitrite: NEGATIVE
Protein, ur: NEGATIVE mg/dL
Specific Gravity, Urine: 1.015 (ref 1.005–1.030)
pH: 5 (ref 5.0–8.0)

## 2020-02-03 LAB — TROPONIN I (HIGH SENSITIVITY)
Troponin I (High Sensitivity): 111 ng/L (ref <18)
Troponin I (High Sensitivity): 106 ng/L (ref <18)

## 2020-02-03 LAB — CBC WITH DIFFERENTIAL/PLATELET
Abs Immature Granulocytes: 0.03 10*3/uL (ref 0.00–0.07)
Basophils Absolute: 0.1 10*3/uL (ref 0.0–0.1)
Basophils Relative: 1 %
Eosinophils Absolute: 0 10*3/uL (ref 0.0–0.5)
Eosinophils Relative: 0 %
HCT: 34.6 % — ABNORMAL LOW (ref 36.0–46.0)
Hemoglobin: 11.2 g/dL — ABNORMAL LOW (ref 12.0–15.0)
Immature Granulocytes: 0 %
Lymphocytes Relative: 17 %
Lymphs Abs: 1.4 10*3/uL (ref 0.7–4.0)
MCH: 29.9 pg (ref 26.0–34.0)
MCHC: 32.4 g/dL (ref 30.0–36.0)
MCV: 92.3 fL (ref 80.0–100.0)
Monocytes Absolute: 0.9 10*3/uL (ref 0.1–1.0)
Monocytes Relative: 11 %
Neutro Abs: 5.5 10*3/uL (ref 1.7–7.7)
Neutrophils Relative %: 71 %
Platelets: 282 10*3/uL (ref 150–400)
RBC: 3.75 MIL/uL — ABNORMAL LOW (ref 3.87–5.11)
RDW: 14.5 % (ref 11.5–15.5)
WBC: 7.9 10*3/uL (ref 4.0–10.5)
nRBC: 0 % (ref 0.0–0.2)

## 2020-02-03 LAB — CBG MONITORING, ED: Glucose-Capillary: 124 mg/dL — ABNORMAL HIGH (ref 70–99)

## 2020-02-03 IMAGING — CT CT CERVICAL SPINE W/O CM
4 of 7 series · 14 of 33 positions shown, 16 images · non-contrast
Comparison: CT head [DATE]

CLINICAL DATA: Fall.  Head trauma.

EXAM:
CT HEAD WITHOUT CONTRAST
CT CERVICAL SPINE WITHOUT CONTRAST
TECHNIQUE: Multidetector CT imaging of the head and cervical spine was
performed following the standard protocol without intravenous
contrast. Multiplanar CT image reconstructions of the cervical spine
were also generated.

[Series 4: head bone · axial · 0.43mm/px · z∈[-88,-4]mm · 3 of 85 slices shown]
[im 22/85  bone]
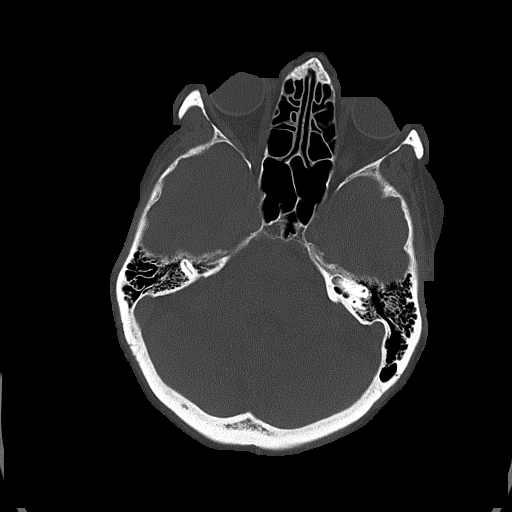
[im 43/85  bone]
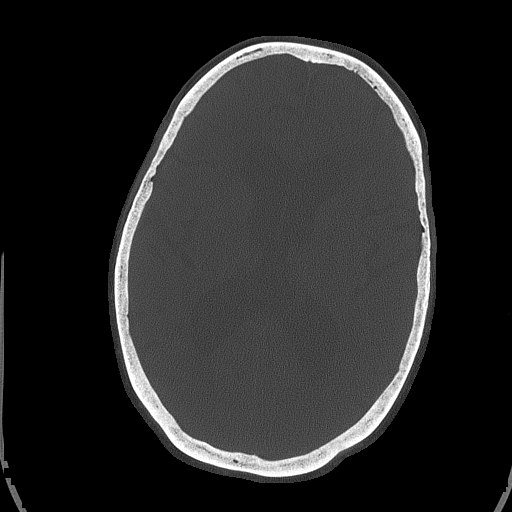
[im 64/85  bone]
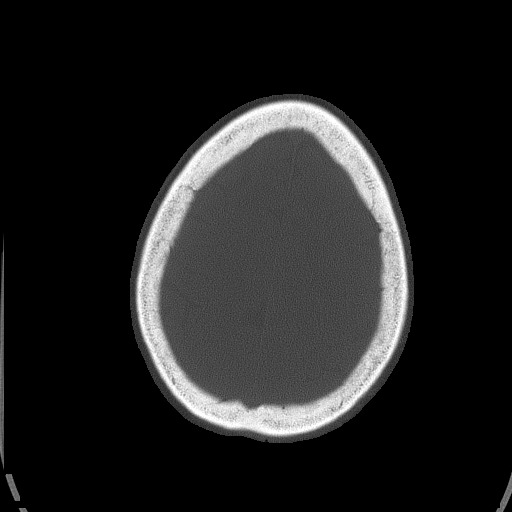

[Series 7: c_spine 2.0 st · axial · 0.30mm/px · z∈[-268,-128]mm · 5 of 106 slices shown, 7 images]
[im 18/106  soft-tissue]
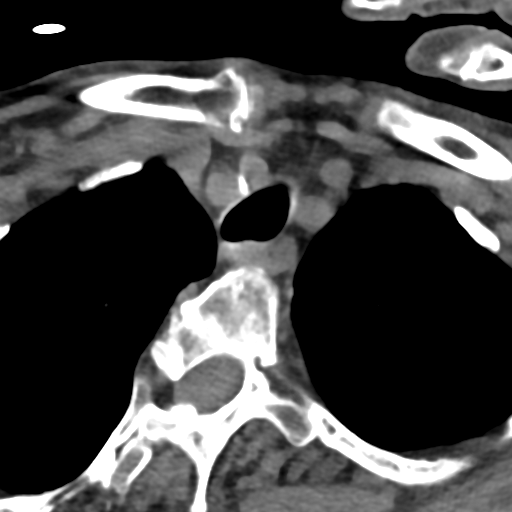
[im 18/106  bone]
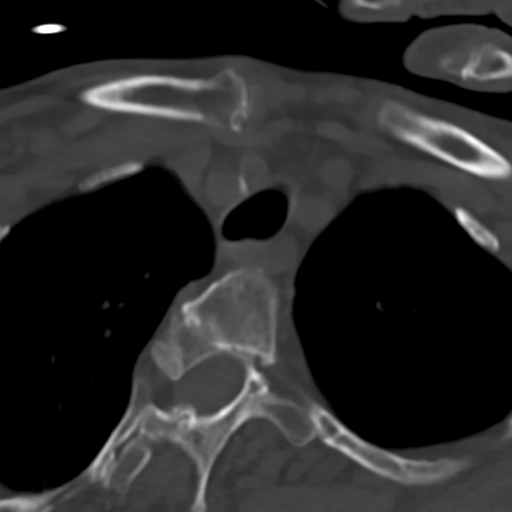
[im 36/106  bone]
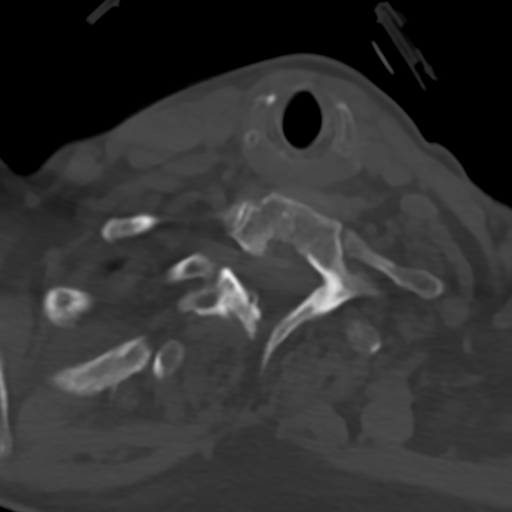
[im 53/106  bone]
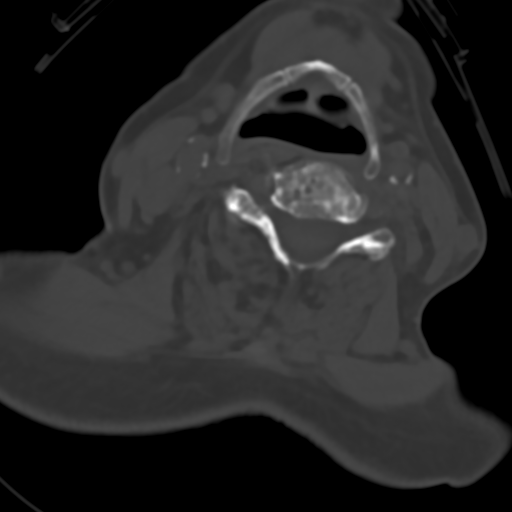
[im 71/106  bone]
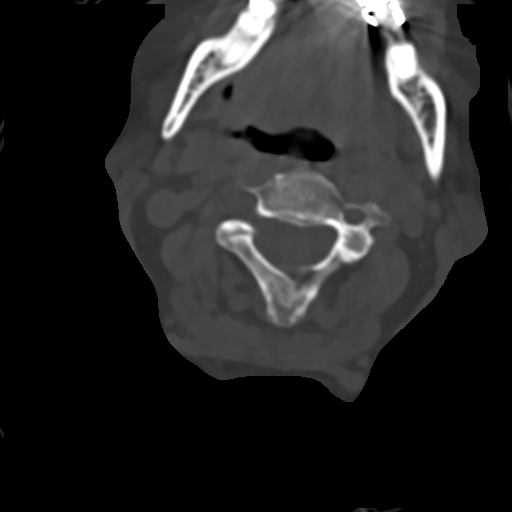
[im 88/106  soft-tissue]
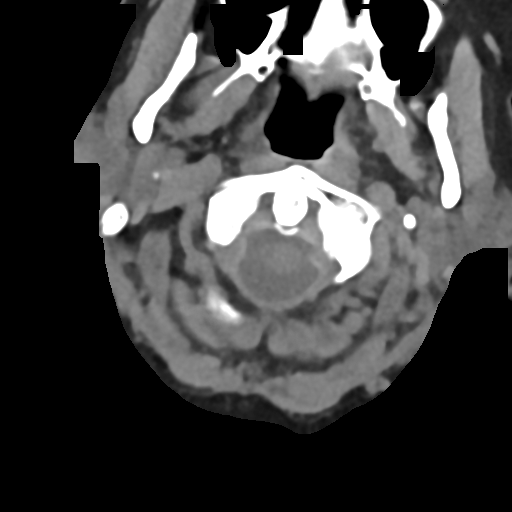
[im 88/106  bone]
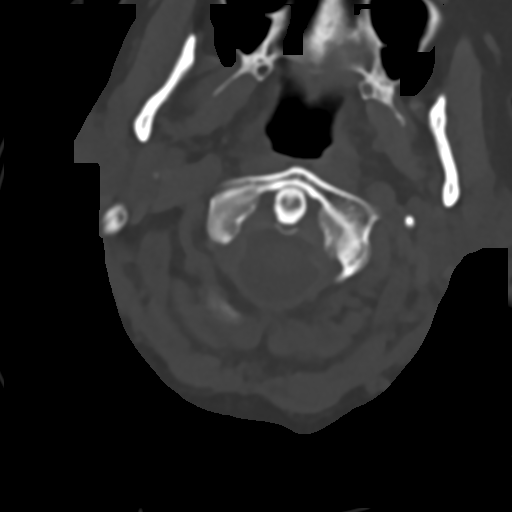

[Series 9: c_spine 2.0 sag bone · sagittal · 0.24mm/px · 5 of 61 slices shown]
[im 11/61  bone]
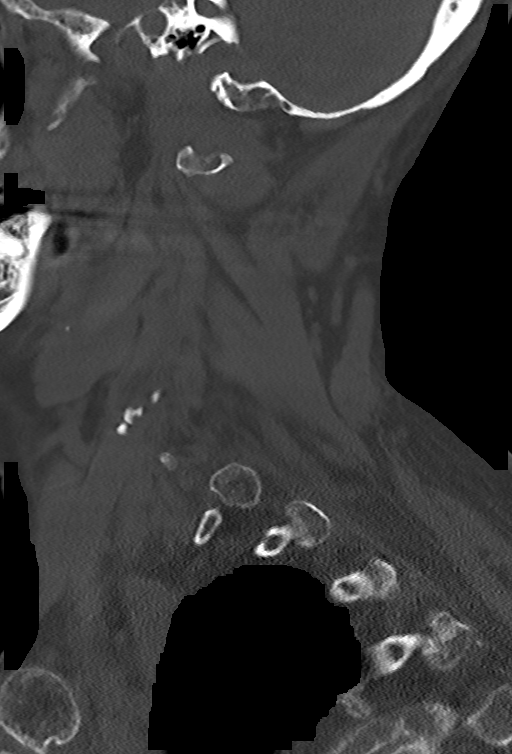
[im 21/61  bone]
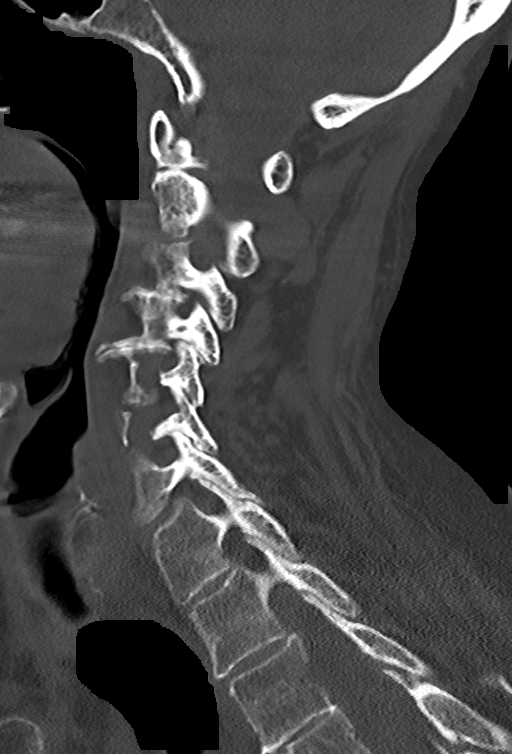
[im 31/61  bone]
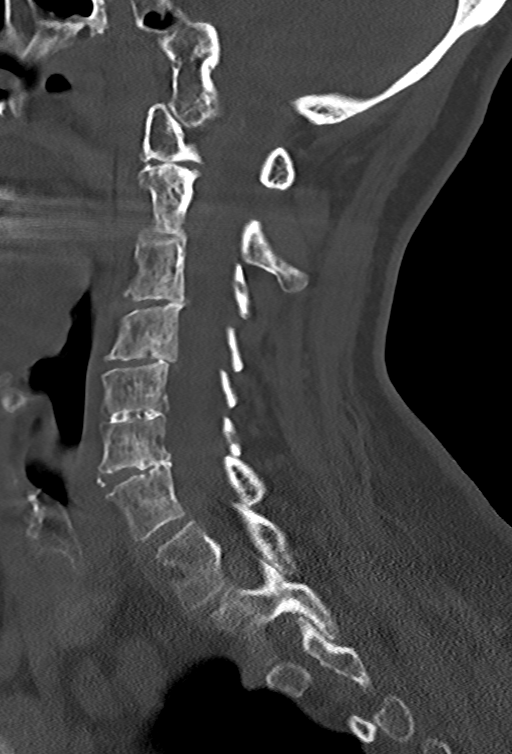
[im 41/61  bone]
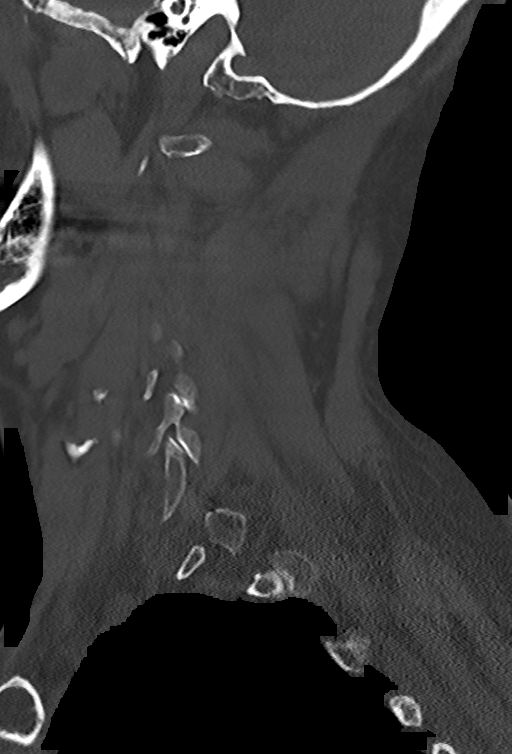
[im 51/61  bone]
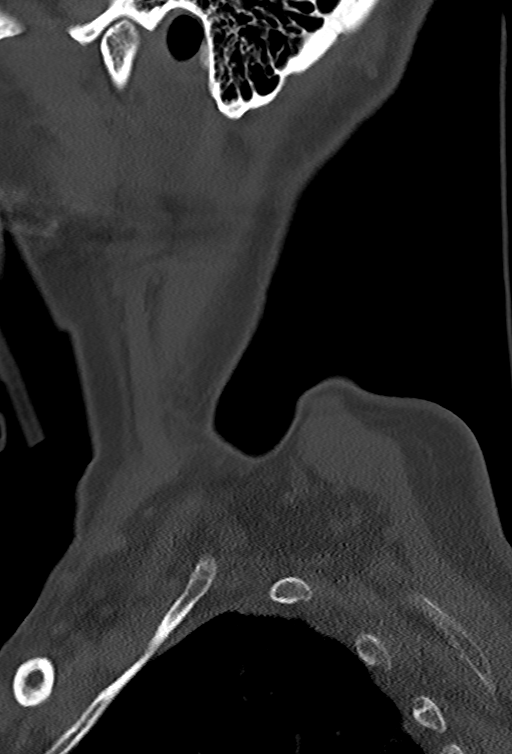

[Series 10: c_spine 2.0 cor bone · coronal · 0.31mm/px · 1 of 61 slices shown]
[im 31/61  bone]
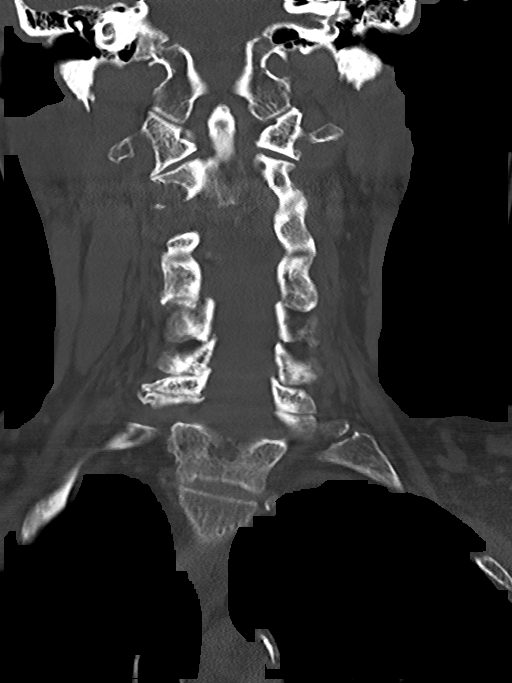

[14 of 33 positions shown; findings below may reference images not displayed]

FINDINGS: CT HEAD FINDINGS

Brain: No acute intracranial injury. Negative for intracranial
hemorrhage.

Generalized atrophy. Stable ventricular enlargement. Diffuse white
matter hypodensity bilaterally unchanged. No acute infarct or mass.

Vascular: Negative for hyperdense vessel

Skull: Negative for skull fracture.

Sinuses/Orbits: Paranasal sinuses clear. Bilateral cataract
extraction.

Other: None

CT CERVICAL SPINE FINDINGS

Alignment: Mild retrolisthesis C3-4 and C4-5. Mild-to-moderate
levoscoliosis.

Skull base and vertebrae: No fracture identified

Soft tissues and spinal canal: No soft tissue mass or swelling. 9 mm
right thyroid nodule. No further imaging necessary.

Disc levels: Multilevel disc degeneration and spurring. Right
foraminal narrowing C3-4, C4-5, C5-6 due to spurring.

Upper chest: Right upper lobe airspace disease and adjacent to the
major fissure. This is partially imaged. Left upper lobe is clear.

Other: None
IMPRESSION: 1. Atrophy and chronic microvascular ischemic changes in the white
matter. No acute intracranial abnormality
2. Cervical scoliosis and spondylosis.  Negative for fracture
3. Right upper lobe airspace disease incompletely imaged. Recommend
follow-up chest x-ray.

## 2020-02-03 IMAGING — DX DG CHEST 1V PORT
1 series · 1 of 1 positions shown · non-contrast
Comparison: [DATE]

CLINICAL DATA: Syncope

EXAM:
PORTABLE CHEST 1 VIEW

[chest ap]
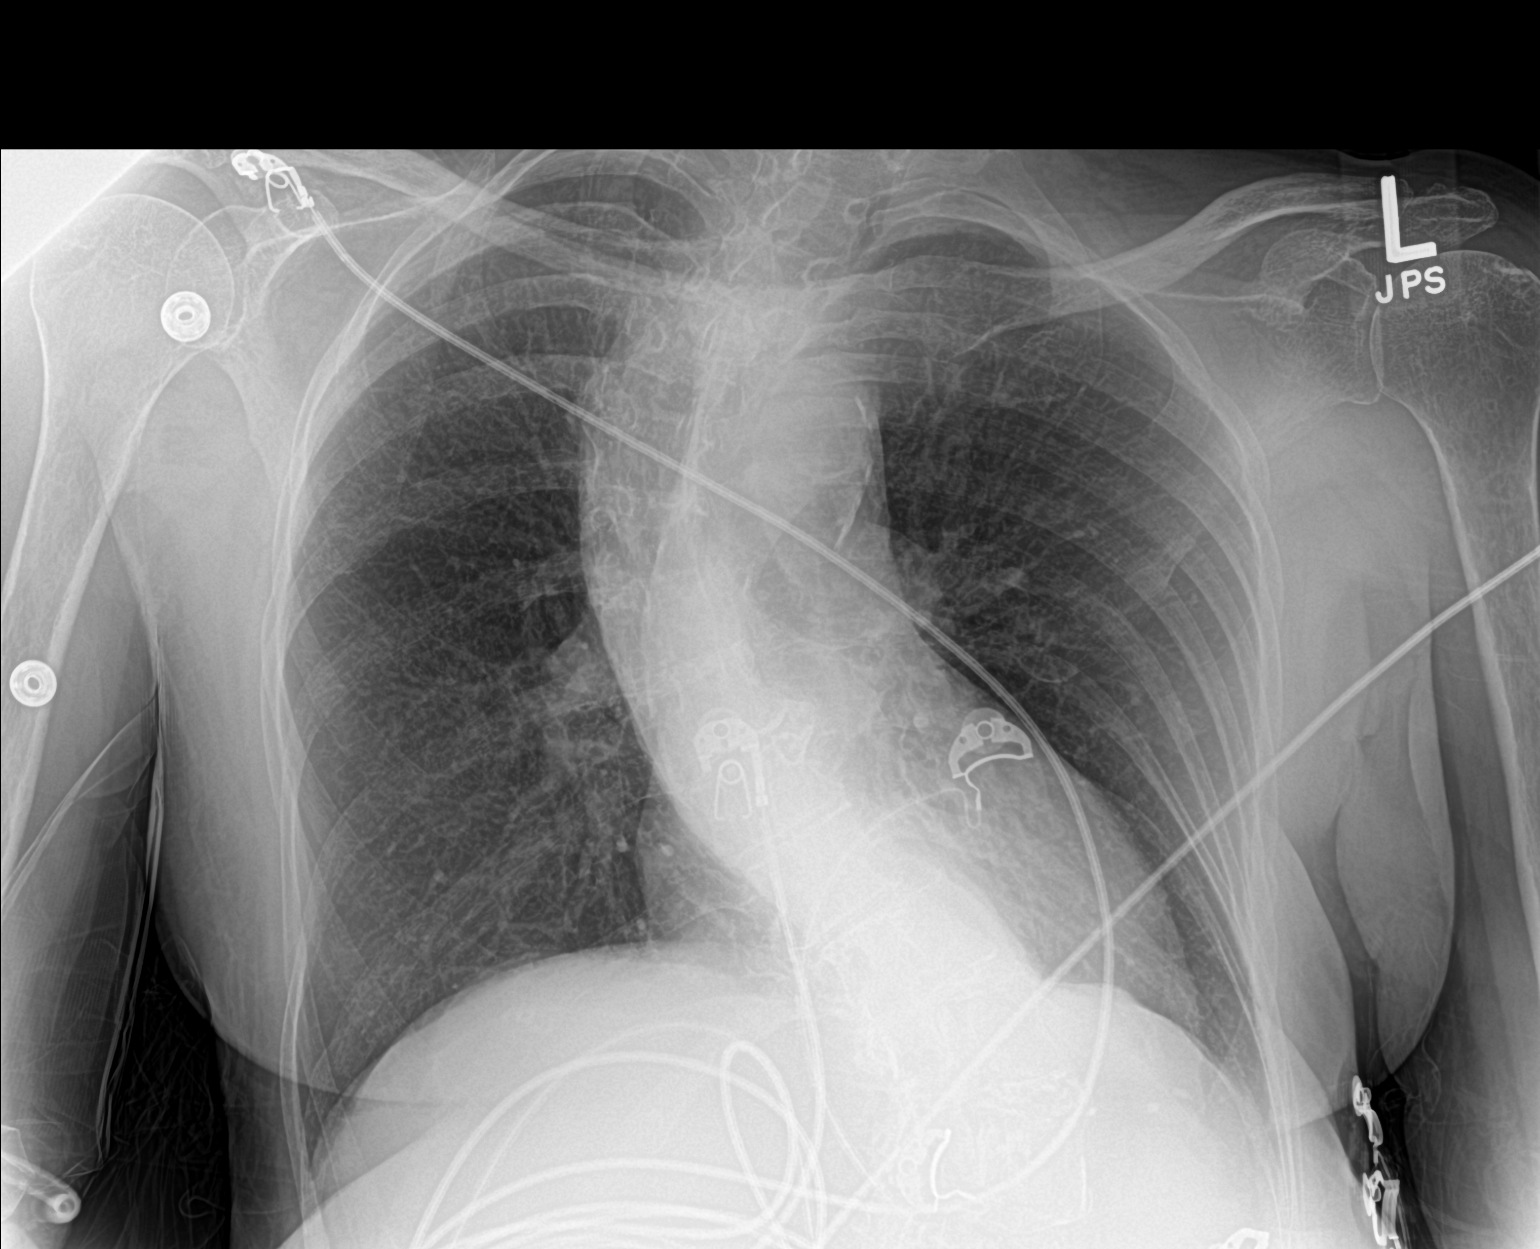

[1 of 1 positions shown; findings below may reference images not displayed]

FINDINGS: Patchy infiltrate within the right mid lung zone has resolved. The
lungs are clear. No pneumothorax or pleural effusion. Cardiac size
within normal limits. Pulmonary vascularity is normal. Moderate to
severe thoracolumbar scoliosis is again noted with resultant
thoracic deformity.
IMPRESSION: No active disease.

## 2020-02-03 MED ORDER — POTASSIUM CHLORIDE CRYS ER 20 MEQ PO TBCR
40.0000 meq | EXTENDED_RELEASE_TABLET | Freq: Once | ORAL | Status: AC
  Administered 2020-02-03: 40 meq via ORAL
  Filled 2020-02-03: qty 2

## 2020-02-03 NOTE — Consult Note (Signed)
Consultation Note Date: 02/03/2020   Patient Name: Isabel Zamora  DOB: 05-18-1929  MRN: [id number]  Age / Sex: 86 y.o., female  PCP: Pcp, No Referring Physician: Davonna Belling, MD  Reason for Consultation: Establishing goals of care  HPI/Patient Profile: 86 y.o. female  with past medical history of hypertension, hyperlipidemia, hypothyroidism, dementia, cerebral aneurysm, and post polio syndrome. She presented to [facility] emergency department on 02/03/2020 with a fall with head laceration.  She was recently admitted 10/15-10/18 with acute encephalopathy, UTI, and AKI.  Recommendation made by PT/OT for SNF, but patient ultimately discharged back to ALF per family request. This morning, she tripped and fell and hit her head.  ED Course: Patient has headache, no other acute complaints. Abnormal EKG (Anterior ST elevation) with elevated troponin. Cardiology consulted. Family has declined cardiac cath or other significant interventions - opts for medical management only.   Clinical Assessment and Goals of Care: I have reviewed medical records including EPIC notes, labs and imaging, examined the patient and met at bedside with daughter/[place]  to discuss diagnosis, prognosis, GOC, EOL wishes, disposition, and options.  I introduced Palliative Medicine as specialized medical care for people living with serious illness. It focuses on providing relief from the symptoms and stress of a serious illness. Daughter is a Immunologist and very familiar with palliative medicine.   We discussed a brief life review of the patient. She is a retired Herbalist. She had 2 children with her first husband. Her second husband died 3 years ago. She has lived at [place] since February 2020.  As far as functional status, patient ambulates with a walker. She can shower herself and feed herself. She does need verbal cues  sometimes for performing these tasks.   We discussed her current illness and what it means in the larger context of her ongoing co-morbidities. Briefly discussed the diagnosis of dementia and its natural trajectory. This eventually includes decreased ability to communicate, ambulate, swallow, and maintain continence.   Based on the underlying dementia, daughter is not interested in pursuing any type of aggressive medical interventions. She is interested in supportive care only. She does feel that her mother will benefit from continued PT/OT.   Advanced directives, concepts specific to code status, artifical feeding and hydration, and rehospitalization were considered and discussed.  MOST form was completed today. The family outlined their wishes for the following treatment decisions:  Cardiopulmonary Resuscitation: Do Not Attempt Resuscitation (DNR/No CPR)  Medical Interventions: Limited Additional Interventions: Use medical treatment, IV fluids and cardiac monitoring as indicated, DO NOT USE intubation or mechanical ventilation. May consider use of less invasive airway support such as BiPAP or CPAP. Also provide comfort measures. Transfer to the hospital if indicated. Avoid intensive care.   Antibiotics: Antibiotics if indicated  IV Fluids: IV fluids for a defined trial period  Feeding Tube: No feeding tube    Hospice and Palliative Care services outpatient were explained and offered. Daughter agrees with referral to outpatient palliative care.    SUMMARY  OF RECOMMENDATIONS   - DNR/DNI - family does not want aggressive medical interventions, supportive care only - plan for D/C back to ALF - referral to outpatient palliative care (daughter requests [facility])  Code Status/Advance Care Planning:  DNR (present on admission)  Palliative Prophylaxis:   Frequent Pain Assessment, Oral Care and Turn Reposition  Prognosis:   Unable to determine  Discharge Planning: Home with  Palliative Services      Primary Diagnoses: Present on Admission: **None**   I have reviewed the medical record, interviewed the patient and family, and examined the patient. The following aspects are pertinent.  Past Medical History:  Diagnosis Date  . Dementia ([place])   . HLD (hyperlipidemia)   . HTN (hypertension)   . Hypothyroidism   . Kyphosis    Medications Prior to Admission:  Prior to Admission medications   Medication Sig Start Date End Date Taking? Authorizing Provider  acetaminophen (TYLENOL) 500 MG tablet Take 1 tablet (500 mg total) by mouth every 6 (six) hours as needed for mild pain or fever. 02/02/20  Yes Pahwani, Einar Grad, MD  aspirin EC 81 MG tablet Take 81 mg by mouth daily. Swallow whole.   Yes [provider]  cholecalciferol (VITAMIN D3) 25 MCG (1000 UNIT) tablet Take 1,000 Units by mouth daily.   Yes [provider]  donepezil (ARICEPT) 10 MG tablet Take 10 mg by mouth at bedtime. 01/13/20  Yes [provider]  Ensure (ENSURE) Take 237 mLs by mouth daily as needed (for missed meal).   Yes [provider]  levothyroxine (SYNTHROID) 25 MCG tablet Take 25 mcg by mouth every Monday, Wednesday, and Friday.  01/28/20  Yes [provider]  levothyroxine (SYNTHROID) 50 MCG tablet Take 50 mcg by mouth every Tuesday, Thursday, Saturday, and Sunday.   Yes [provider]  mirtazapine (REMERON) 7.5 MG tablet Take 7.5 mg by mouth at bedtime. 01/13/20  Yes [provider]  Multiple Vitamins-Minerals (MULTIVITAMIN ADULT) CHEW Chew 1 tablet by mouth in the morning. 02/02/20 03/03/20 Yes Darliss Cheney, MD   Allergies  Allergen Reactions  . Morphine And Related   . Penicillins    Review of Systems  Unable to perform ROS: Dementia    Physical Exam Vitals reviewed.  Constitutional:      General: She is not in acute distress. HENT:     Head: Normocephalic and atraumatic.  Cardiovascular:     Rate and Rhythm:  Normal rate.  Pulmonary:     Effort: Pulmonary effort is normal.  Neurological:     Mental Status: She is alert.     Vital Signs: BP 134/63   Pulse 93   Temp 98.2 F (36.8 C) (Oral)   Resp 16   SpO2 96%  Pain Scale: 0-10   Pain Score: 3    SpO2: SpO2: 96 % O2 Device:SpO2: 96 % O2 Flow Rate: .      Palliative Assessment/Data: PPS 50%    Time In: 14:30 Time Out: 17:00 Time In: 18:00 Time Out: 18:43 Time Total: 73 minutes  Greater than 50%  of this time was spent counseling and coordinating care related to the above assessment and plan.  Signed by: Lavena Bullion, NP   Please contact Palliative Medicine Team phone at [phone] for questions and concerns.  For individual provider: See Shea Evans

## 2020-02-03 NOTE — Consult Note (Signed)
ER Consult Note   Isabel Zamora ZOX:[id number] DOB: 06/22/1929 DOA: 02/03/2020  PCP:  Erick BlinksMichelle Haber Consultants:  None Patient coming from: [facility]; NOK: Isabel Zamora, [phone]  Chief Complaint: Fall  HPI: Isabel Zamora is a 86 y.o. female with medical history significant of HTN; HLD: dementia; hypothyroidism; post-polio syndrome; cerebral aneurysm; and recent admission for AMS associated with UTI vs. [place] (admitted 10/15-18, discharged back to ALF at family request despite recommendation for SNF per PT/OT) presenting with a fall with head laceration.  She was here on 10/15 with R-sided weakness, expressive aphasia, left sided gaze - resolved and attributed to "rip roaring UTI".  She was started on Seroquel and eventually returned closer to baseline.  Yesterday morning she was doing ok and finally discharged to ALF about 430.  She was having some improvement while at her facility and seemed close to baseline without apparent difficulty overnight.  This AM, she tripped and fell when her "feet got tangled" and she hit her head.  She is having a headache and has a posterior head lac but is otherwise without complaint.  In further discussion, her daughter's clear preference is for her to return to ALF.  Declines admission, declines cath or other significant intervention.  Will hire a sitter if needed but does not desire transition to SNF.  She is open to palliative care consultation.    ED Course:   Fall, recent hospitalization.  Uncertain what happened because of dementia.  Anterior ST elevation with elevated troponin.  Cardiology will consult, does not think STEMI.  Head lac will be stapled.  DNR.  Review of Systems: As per HPI; otherwise review of systems reviewed and negative. History limited by dementia.   COVID Vaccine Status:  Complete  Past Medical History:  Diagnosis Date  . Dementia (H[facility])   . HLD (hyperlipidemia)   . HTN (hypertension)   . Hypothyroidism   . Kyphosis      History reviewed. No pertinent surgical history.  Social History   Socioeconomic History  . Marital status: Unknown    Spouse name: Not on file  . Number of children: Not on file  . Years of education: Not on file  . Highest education level: Not on file  Occupational History  . Not on file  Tobacco Use  . Smoking status: Never Smoker  . Smokeless tobacco: Never Used  Substance and Sexual Activity  . Alcohol use: Never  . Drug use: Never  . Sexual activity: Not Currently  Other Topics Concern  . Not on file  Social History Narrative  . Not on file   Social Determinants of Health   Financial Resource Strain:   . Difficulty of Paying Living Expenses: Not on file  Food Insecurity:   . Worried About Programme researcher, broadcasting/film/videounning Out of Food in the Last Year: Not on file  . Ran Out of Food in the Last Year: Not on file  Transportation Needs:   . Lack of Transportation (Medical): Not on file  . Lack of Transportation (Non-Medical): Not on file  Physical Activity:   . Days of Exercise per Week: Not on file  . Minutes of Exercise per Session: Not on file  Stress:   . Feeling of Stress : Not on file  Social Connections:   . Frequency of Communication with Friends and Family: Not on file  . Frequency of Social Gatherings with Friends and Family: Not on file  . Attends Religious Services: Not on file  . Active Member of Clubs or  Organizations: Not on file  . Attends Banker Meetings: Not on file  . Marital Status: Not on file  Intimate Partner Violence:   . Fear of Current or Ex-Partner: Not on file  . Emotionally Abused: Not on file  . Physically Abused: Not on file  . Sexually Abused: Not on file    Allergies  Allergen Reactions  . Morphine And Related   . Penicillins     No family history on file.  Prior to Admission medications   Medication Sig Start Date End Date Taking? Authorizing Provider  acetaminophen (TYLENOL) 500 MG tablet Take 1 tablet (500 mg total) by  mouth every 6 (six) hours as needed for mild pain or fever. 02/02/20   Hughie Closs, MD  aspirin EC 81 MG tablet Take 81 mg by mouth daily. Swallow whole.    [provider]  cholecalciferol (VITAMIN D3) 25 MCG (1000 UNIT) tablet Take 1,000 Units by mouth daily.    [provider]  donepezil (ARICEPT) 10 MG tablet Take 10 mg by mouth at bedtime. 01/13/20   [provider]  levothyroxine (SYNTHROID) 25 MCG tablet Take 25 mcg by mouth every Monday, Wednesday, and Friday.  01/28/20   [provider]  levothyroxine (SYNTHROID) 50 MCG tablet Take 50 mcg by mouth every Tuesday, Thursday, Saturday, and Sunday.    [provider]  mirtazapine (REMERON) 7.5 MG tablet Take 7.5 mg by mouth at bedtime. 01/13/20   [provider]  Multiple Vitamins-Minerals (MULTIVITAMIN ADULT) CHEW Chew 1 tablet by mouth in the morning. 02/02/20 03/03/20  Hughie Closs, MD    Physical Exam: Vitals:   02/03/20 0955  BP: 134/63  Pulse: 93  Resp: 16  Temp: 98.2 F (36.8 C)  TempSrc: Oral  SpO2: 96%     . General:  Appears calm and comfortable and is NAD; hemostatic posterior head lac, for stapling . Eyes:  PERRL, EOMI, normal lids, iris . ENT:  grossly normal hearing, lips & tongue, mmm . Neck:  no LAD, masses or thyromegaly . Cardiovascular:  RRR, no m/r/g. No LE edema.  Marland Kitchen Respiratory:   CTA bilaterally with no wheezes/rales/rhonchi.  Normal respiratory effort. . Abdomen:  soft, NT, ND, NABS . Skin:  no rash or induration seen on limited exam . Musculoskeletal:  grossly normal tone BUE/BLE, good ROM, no bony abnormality . Psychiatric:  blunted mood and affect, speech fluent and appropriate . Neurologic:  CN 2-12 grossly intact, moves all extremities in coordinated fashion    Radiological Exams on Admission: CT Head Wo Contrast  Result Date: 02/03/2020 CLINICAL DATA:  Fall.  Head trauma. EXAM: CT HEAD WITHOUT CONTRAST CT CERVICAL SPINE WITHOUT CONTRAST  TECHNIQUE: Multidetector CT imaging of the head and cervical spine was performed following the standard protocol without intravenous contrast. Multiplanar CT image reconstructions of the cervical spine were also generated. COMPARISON:  CT head 01/30/2020 FINDINGS: CT HEAD FINDINGS Brain: No acute intracranial injury. Negative for intracranial hemorrhage. Generalized atrophy. Stable ventricular enlargement. Diffuse white matter hypodensity bilaterally unchanged. No acute infarct or mass. Vascular: Negative for hyperdense vessel Skull: Negative for skull fracture. Sinuses/Orbits: Paranasal sinuses clear. Bilateral cataract extraction. Other: None CT CERVICAL SPINE FINDINGS Alignment: Mild retrolisthesis C3-4 and C4-5. Mild-to-moderate levoscoliosis. Skull base and vertebrae: No fracture identified Soft tissues and spinal canal: No soft tissue mass or swelling. 9 mm right thyroid nodule. No further imaging necessary. Disc levels: Multilevel disc degeneration and spurring. Right foraminal narrowing C3-4, C4-5, C5-6 due to spurring.  Upper chest: Right upper lobe airspace disease and adjacent to the major fissure. This is partially imaged. Left upper lobe is clear. Other: None IMPRESSION: 1. Atrophy and chronic microvascular ischemic changes in the white matter. No acute intracranial abnormality 2. Cervical scoliosis and spondylosis.  Negative for fracture 3. Right upper lobe airspace disease incompletely imaged. Recommend follow-up chest x-ray. Electronically Signed   By: Marlan Palau M.D.   On: 02/03/2020 11:24   CT Cervical Spine Wo Contrast  Result Date: 02/03/2020 CLINICAL DATA:  Fall.  Head trauma. EXAM: CT HEAD WITHOUT CONTRAST CT CERVICAL SPINE WITHOUT CONTRAST TECHNIQUE: Multidetector CT imaging of the head and cervical spine was performed following the standard protocol without intravenous contrast. Multiplanar CT image reconstructions of the cervical spine were also generated. COMPARISON:  CT head  01/30/2020 FINDINGS: CT HEAD FINDINGS Brain: No acute intracranial injury. Negative for intracranial hemorrhage. Generalized atrophy. Stable ventricular enlargement. Diffuse white matter hypodensity bilaterally unchanged. No acute infarct or mass. Vascular: Negative for hyperdense vessel Skull: Negative for skull fracture. Sinuses/Orbits: Paranasal sinuses clear. Bilateral cataract extraction. Other: None CT CERVICAL SPINE FINDINGS Alignment: Mild retrolisthesis C3-4 and C4-5. Mild-to-moderate levoscoliosis. Skull base and vertebrae: No fracture identified Soft tissues and spinal canal: No soft tissue mass or swelling. 9 mm right thyroid nodule. No further imaging necessary. Disc levels: Multilevel disc degeneration and spurring. Right foraminal narrowing C3-4, C4-5, C5-6 due to spurring. Upper chest: Right upper lobe airspace disease and adjacent to the major fissure. This is partially imaged. Left upper lobe is clear. Other: None IMPRESSION: 1. Atrophy and chronic microvascular ischemic changes in the white matter. No acute intracranial abnormality 2. Cervical scoliosis and spondylosis.  Negative for fracture 3. Right upper lobe airspace disease incompletely imaged. Recommend follow-up chest x-ray. Electronically Signed   By: Marlan Palau M.D.   On: 02/03/2020 11:24   DG Chest Portable 1 View  Result Date: 02/03/2020 CLINICAL DATA:  Syncope EXAM: PORTABLE CHEST 1 VIEW COMPARISON:  01/30/2020 FINDINGS: Patchy infiltrate within the right mid lung zone has resolved. The lungs are clear. No pneumothorax or pleural effusion. Cardiac size within normal limits. Pulmonary vascularity is normal. Moderate to severe thoracolumbar scoliosis is again noted with resultant thoracic deformity. IMPRESSION: No active disease. Electronically Signed   By: Helyn Numbers MD   On: 02/03/2020 12:10    EKG: Independently reviewed.   83 - NSR with rate 97; LAFB; LVH; ?ST elevation V1-4 1054 - NSR with rate 80; LAFB; LVH; ST  elevation V1-3  Labs on Admission: I have personally reviewed the available labs and imaging studies at the time of the admission.  Pertinent labs:   K+ 3.4 Glucose 112 Albumin 2.9 AST 51/ALT 26 HS troponin 111 WBC 7.9 Hgb 11.2   Assessment/Plan Principal Problem:   Fall at home, initial encounter Active Problems:   HTN (hypertension)   HLD (hyperlipidemia)   STEMI (ST elevation myocardial infarction) (HCC)   Dementia without behavioral disturbance (HCC)   Hypothyroidism (acquired)    Fall -Patient with prior hospital d/c yesterday for AMS associated with UTI -Reportedly doing better last night but she fell this AM -Conflicting stories about whether there was LOC, syncope - but patient reports tripping and hitting her head -She has a posterior scalp lac that will be repaired by the EDP -She was previously recommended for SNF but based on ACP daughter declined and requested transition back to home at ALF -Offered observation but patient's daughter requests d/c back to ALF -Palliative care consultation  requested for ongoing home comfort measures without significant further intervention  Apparent STEMI -V1-3 ST elevation with troponin elevation -Cardiology was consulted and daughter still requests this -However, patient/daughter will decline cardiac cath or further treatment and will plan for d/c to home from the ER today -She does appear to be very comfortable and does not c/o chest pain, but also has underlying cognitive impairment and so history is uncertain  Dementia -Based mild to moderate cognitive impairment -The patient is most comfortable in her home environment at [facility] and so daughter requests d/c there -Can continue Aricept and Remeron if desired -Outpatient palliative care vs. hospice  HTN -She does not appear to be taking medications for this issue at this time  HLD -She does not appear to be taking medications for this issue at this  time  Hypothyroidism -Continue Synthroid   Note: This patient was not tested for COVID-19. The patient has been fully vaccinated against COVID-19.    Thank you for this interesting consult.  The patient/family have requested ER discharge with outpatient palliative care/hospice.  As a result, [facility] will sign off at this time.   Jonah Blue MD [facility]   How to contact the [facility] Attending or Consulting provider 7A - 7P or covering provider during after hours 7P -7A, for this patient?  1. Check the care team in [facility] and look for a) attending/consulting [facility] provider listed and b) the [facility] team listed 2. Log into [email or web address] and use [facility]'s universal password to access. If you do not have the password, please contact the hospital operator. 3. Locate the [facility] provider you are looking for under [facility] and page to a number that you can be directly reached. 4. If you still have difficulty reaching the provider, please page the [facility] (Director on Call) for the Hospitalists listed on amion for assistance.   02/03/2020, 3:43 PM

## 2020-02-03 NOTE — ED Notes (Signed)
LAB called with positive trop of 111

## 2020-02-03 NOTE — Progress Notes (Signed)
Echo per Dr. Royann Shivers

## 2020-02-03 NOTE — ED Provider Notes (Signed)
  Physical Exam  BP 140/76   Pulse 93   Temp 98.2 F (36.8 C) (Oral)   Resp 20   SpO2 95%   Physical Exam  ED Course/Procedures     Procedures  MDM  Care assumed at 3 PM. Patient was just discharged from the hospital yesterday and had a fall. She had EKG changes and troponin was elevated at 110. Signout pending cardiology consult and palliative care consult as well as hospitalist consult  7:05 PM Multiple consultants had talked to the patient and her daughter. She is DNR but she is not on hospice. They will follow up with palliative care outpatient. Cardiology in particular did not recommend any intervention. Offered admission for observation versus outpatient follow-up and daughter definitely wants to take her home. She had 1 staple placed and daughter states that she was given the kit and will remove it herself in a week.   Drenda Freeze, MD 02/03/20 [phone]

## 2020-02-03 NOTE — Progress Notes (Signed)
Dr. Royann Shivers recommends OP echo and f/u. I have sent a message to our office's scheduling team requesting a follow-up appointment, and our office will call the patient's daughter with this information.

## 2020-02-03 NOTE — ED Provider Notes (Addendum)
[name] EMERGENCY DEPARTMENT Provider Note   CSN: [id number] Arrival date & time: 02/03/20  0945     History Chief Complaint  Patient presents with  . Fall  . Laceration    Isabel Zamora is a 86 y.o. female.  HPI Level 5 caveat due to dementia. Patient reportedly sent in for unwitnessed fall.  Laceration to back of head.  Patient states she fell.  Patient told me she lost her balance and told the nurse that she has passed out.  With the dementia with short-term memory loss she is not a good historian and I do not think it is reliable.  Denies chest pain at this time.  Denies other injury.  Discharged home to nursing home yesterday after mental status change that was thought to be secondary to UTI.  Also had stroke work-up that was negative.    Past Medical History:  Diagnosis Date  . Dementia (HCC)   . HLD (hyperlipidemia)   . HTN (hypertension)   . Hypothyroidism   . Kyphosis     Patient Active Problem List   Diagnosis Date Noted  . UTI (urinary tract infection) 01/30/2020  . AKI (acute kidney injury) (HCC) 01/30/2020  . HTN (hypertension) 01/30/2020  . HLD (hyperlipidemia) 01/30/2020  . Acute metabolic encephalopathy 01/30/2020    History reviewed. No pertinent surgical history.   OB History   No obstetric history on file.     No family history on file.  Social History   Tobacco Use  . Smoking status: Never Smoker  . Smokeless tobacco: Never Used  Substance Use Topics  . Alcohol use: Never  . Drug use: Never    Home Medications Prior to Admission medications   Medication Sig Start Date End Date Taking? Authorizing Provider  acetaminophen (TYLENOL) 500 MG tablet Take 1 tablet (500 mg total) by mouth every 6 (six) hours as needed for mild pain or fever. 02/02/20  Yes Pahwani, Daleen Bo, MD  aspirin EC 81 MG tablet Take 81 mg by mouth daily. Swallow whole.   Yes [provider]  cholecalciferol (VITAMIN D3) 25 MCG (1000 UNIT)  tablet Take 1,000 Units by mouth daily.   Yes [provider]  donepezil (ARICEPT) 10 MG tablet Take 10 mg by mouth at bedtime. 01/13/20  Yes [provider]  Ensure (ENSURE) Take 237 mLs by mouth daily as needed (for missed meal).   Yes [provider]  levothyroxine (SYNTHROID) 25 MCG tablet Take 25 mcg by mouth every Monday, Wednesday, and Friday.  01/28/20  Yes [provider]  levothyroxine (SYNTHROID) 50 MCG tablet Take 50 mcg by mouth every Tuesday, Thursday, Saturday, and Sunday.   Yes [provider]  mirtazapine (REMERON) 7.5 MG tablet Take 7.5 mg by mouth at bedtime. 01/13/20  Yes [provider]  Multiple Vitamins-Minerals (MULTIVITAMIN ADULT) CHEW Chew 1 tablet by mouth in the morning. 02/02/20 03/03/20 Yes Hughie Closs, MD    Allergies    Morphine and related and Penicillins  Review of Systems   Review of Systems  Unable to perform ROS: Dementia    Physical Exam Updated Vital Signs BP 134/63   Pulse 93   Temp 98.2 F (36.8 C) (Oral)   Resp 16   SpO2 96%   Physical Exam Vitals reviewed.  Constitutional:      Appearance: Normal appearance.  HENT:     Head:     Comments: Approximately 2 cm laceration to occipital area. Eyes:  General: No scleral icterus. Neck:     Comments: No midline tenderness but cervical collar in place. Cardiovascular:     Rate and Rhythm: Regular rhythm.  Pulmonary:     Breath sounds: No wheezing or rhonchi.  Abdominal:     Tenderness: There is no abdominal tenderness.  Musculoskeletal:        General: No tenderness.     Cervical back: Neck supple. No tenderness.     Comments: No extremity tenderness.  Skin:    General: Skin is warm.     Capillary Refill: Capillary refill takes less than 2 seconds.  Neurological:     Mental Status: She is alert.     Comments: Awake and pleasant but some mild confusion and difficulty with memory.     ED Results / Procedures / Treatments     Labs (all labs ordered are listed, but only abnormal results are displayed) Labs Reviewed  COMPREHENSIVE METABOLIC PANEL - Abnormal; Notable for the following components:      Result Value   Potassium 3.4 (*)    Glucose, Bld 112 (*)    BUN 6 (*)    Total Protein 6.4 (*)    Albumin 2.9 (*)    AST 51 (*)    All other components within normal limits  CBC WITH DIFFERENTIAL/PLATELET - Abnormal; Notable for the following components:   RBC 3.75 (*)    Hemoglobin 11.2 (*)    HCT 34.6 (*)    All other components within normal limits  CBG MONITORING, ED - Abnormal; Notable for the following components:   Glucose-Capillary 124 (*)    All other components within normal limits  TROPONIN I (HIGH SENSITIVITY) - Abnormal; Notable for the following components:   Troponin I (High Sensitivity) 111 (*)    All other components within normal limits  URINE CULTURE  URINALYSIS, ROUTINE W REFLEX MICROSCOPIC  TROPONIN I (HIGH SENSITIVITY)    EKG EKG Interpretation  Date/Time:  Tuesday February 03 2020 09:53:55 EDT Ventricular Rate:  97 PR Interval:    QRS Duration: 109 QT Interval:  381 QTC Calculation: 484 R Axis:   -69 Text Interpretation: Sinus rhythm Left anterior fascicular block Left ventricular hypertrophy Anterior infarct, old Nonspecific T abnormalities, lateral leads anterior  ST changes Brugada? Confirmed by Benjiman Core [phone]) on 02/03/2020 10:02:42 AM  EKG Interpretation  Date/Time:  Tuesday February 03 2020 10:54:39 EDT Ventricular Rate:  80 PR Interval:    QRS Duration: 113 QT Interval:  438 QTC Calculation: 506 R Axis:   -63 Text Interpretation: Sinus rhythm Left anterior fascicular block Left ventricular hypertrophy anterior ST elevation. simmilar to earlier today. Confirmed by Benjiman Core [phone]) on 02/03/2020 11:00:08 AM        Radiology CT Head Wo Contrast  Result Date: 02/03/2020 CLINICAL DATA:  Fall.  Head trauma. EXAM: CT HEAD WITHOUT CONTRAST CT  CERVICAL SPINE WITHOUT CONTRAST TECHNIQUE: Multidetector CT imaging of the head and cervical spine was performed following the standard protocol without intravenous contrast. Multiplanar CT image reconstructions of the cervical spine were also generated. COMPARISON:  CT head 01/30/2020 FINDINGS: CT HEAD FINDINGS Brain: No acute intracranial injury. Negative for intracranial hemorrhage. Generalized atrophy. Stable ventricular enlargement. Diffuse white matter hypodensity bilaterally unchanged. No acute infarct or mass. Vascular: Negative for hyperdense vessel Skull: Negative for skull fracture. Sinuses/Orbits: Paranasal sinuses clear. Bilateral cataract extraction. Other: None CT CERVICAL SPINE FINDINGS Alignment: Mild retrolisthesis C3-4 and C4-5. Mild-to-moderate levoscoliosis. Skull base and vertebrae: No fracture identified Soft  tissues and spinal canal: No soft tissue mass or swelling. 9 mm right thyroid nodule. No further imaging necessary. Disc levels: Multilevel disc degeneration and spurring. Right foraminal narrowing C3-4, C4-5, C5-6 due to spurring. Upper chest: Right upper lobe airspace disease and adjacent to the major fissure. This is partially imaged. Left upper lobe is clear. Other: None IMPRESSION: 1. Atrophy and chronic microvascular ischemic changes in the white matter. No acute intracranial abnormality 2. Cervical scoliosis and spondylosis.  Negative for fracture 3. Right upper lobe airspace disease incompletely imaged. Recommend follow-up chest x-ray. Electronically Signed   By: Marlan Palauharles  Clark M.D.   On: 02/03/2020 11:24   CT Cervical Spine Wo Contrast  Result Date: 02/03/2020 CLINICAL DATA:  Fall.  Head trauma. EXAM: CT HEAD WITHOUT CONTRAST CT CERVICAL SPINE WITHOUT CONTRAST TECHNIQUE: Multidetector CT imaging of the head and cervical spine was performed following the standard protocol without intravenous contrast. Multiplanar CT image reconstructions of the cervical spine were also  generated. COMPARISON:  CT head 01/30/2020 FINDINGS: CT HEAD FINDINGS Brain: No acute intracranial injury. Negative for intracranial hemorrhage. Generalized atrophy. Stable ventricular enlargement. Diffuse white matter hypodensity bilaterally unchanged. No acute infarct or mass. Vascular: Negative for hyperdense vessel Skull: Negative for skull fracture. Sinuses/Orbits: Paranasal sinuses clear. Bilateral cataract extraction. Other: None CT CERVICAL SPINE FINDINGS Alignment: Mild retrolisthesis C3-4 and C4-5. Mild-to-moderate levoscoliosis. Skull base and vertebrae: No fracture identified Soft tissues and spinal canal: No soft tissue mass or swelling. 9 mm right thyroid nodule. No further imaging necessary. Disc levels: Multilevel disc degeneration and spurring. Right foraminal narrowing C3-4, C4-5, C5-6 due to spurring. Upper chest: Right upper lobe airspace disease and adjacent to the major fissure. This is partially imaged. Left upper lobe is clear. Other: None IMPRESSION: 1. Atrophy and chronic microvascular ischemic changes in the white matter. No acute intracranial abnormality 2. Cervical scoliosis and spondylosis.  Negative for fracture 3. Right upper lobe airspace disease incompletely imaged. Recommend follow-up chest x-ray. Electronically Signed   By: Marlan Palauharles  Clark M.D.   On: 02/03/2020 11:24   DG Chest Portable 1 View  Result Date: 02/03/2020 CLINICAL DATA:  Syncope EXAM: PORTABLE CHEST 1 VIEW COMPARISON:  01/30/2020 FINDINGS: Patchy infiltrate within the right mid lung zone has resolved. The lungs are clear. No pneumothorax or pleural effusion. Cardiac size within normal limits. Pulmonary vascularity is normal. Moderate to severe thoracolumbar scoliosis is again noted with resultant thoracic deformity. IMPRESSION: No active disease. Electronically Signed   By: Helyn NumbersAshesh  Parikh MD   On: 02/03/2020 12:10    Procedures .Marland Kitchen.Laceration Repair  Date/Time: 02/03/2020 3:30 PM Performed by: Benjiman CorePickering,  Suri Tafolla, MD Authorized by: Benjiman CorePickering, Estoria Geary, MD   Consent:    Consent obtained:  Verbal   Consent given by:  Guardian   Risks discussed:  Infection, pain, need for additional repair, poor cosmetic result, retained foreign body, nerve damage and poor wound healing   Alternatives discussed:  No treatment and delayed treatment Anesthesia (see MAR for exact dosages):    Anesthesia method:  None Laceration details:    Location:  Scalp   Scalp location:  Occipital   Length (cm):  1 Repair type:    Repair type:  Simple Pre-procedure details:    Preparation:  Imaging obtained to evaluate for foreign bodies Exploration:    Wound exploration: wound explored through full range of motion     Contaminated: no   Treatment:    Amount of cleaning:  Standard Skin repair:    Repair method:  Staples   Number of staples:  1 Approximation:    Approximation:  Close Post-procedure details:    Dressing:  Open (no dressing)   Patient tolerance of procedure:  Tolerated well, no immediate complications   (including critical care time)  Medications Ordered in ED Medications - No data to display  ED Course  I have reviewed the triage vital signs and the nursing notes.  Pertinent labs & imaging results that were available during my care of the patient were reviewed by me and considered in my medical decision making (see chart for details).    MDM Rules/Calculators/A&P                          Patient presents after fall.  Scalp laceration.  Unsure if there was syncope or not.  There are however anterior ST changes and T wave changes on her EKG compared to 4 days ago.  Patient denies chest pain.  Cannot really trust her history from prior events but I think she does not have current pain.  Had discussed the initial EKG with Dr. [place] from cardiology.  Not a STEMI candidate since no chest pain.  Troponin then returned at 111.  Discussed with him again and can be admitted to with consult to cardiology  but does not meet the STEMI service at this time. Will discuss with hospitalist for admission.  We will also close wound on scalp.  I have independently reviewed imaging, lab work and EKG results.  I have had further discussions with the patient, her daughter and also the hospitalist has been involved.  Will not admit to the hospital.  Patient's family does want cardiology consult but not plan for admission to the hospital or likely intervention.  We will also get palliative medicine consult while she is in the ER.   Final Clinical Impression(s) / ED Diagnoses Final diagnoses:  Fall, initial encounter  Laceration of scalp, initial encounter  Elevated troponin    Rx / DC Orders ED Discharge Orders    None       Benjiman Core, MD 02/03/20 1523    Benjiman Core, MD 02/03/20 1531

## 2020-02-03 NOTE — ED Triage Notes (Signed)
Pt here after unwitnessed fall with a head lac. Pt states she had LOC but pt is also hx of dementia.

## 2020-02-03 NOTE — Discharge Instructions (Signed)
Your troponin was mildly elevated.   You also have a staple in your head and please remove it in a week.  Continue taking your current meds.  Follow-up with palliative care outpatient and your primary care doctor.  Return to ER if you have another fall, chest pain, trouble breathing, fevers

## 2020-02-03 NOTE — Progress Notes (Signed)
[facility])  Received referral for hospice by Palliative NP. Spoke with daughter Cerria who is at bedside with patient. She mentions wanting to possibly do some Physical Therapy once patient is discharged back to [place]. I educated on hospice services and family would like to speak with PCP to ask about options before moving forward with hospice. Daughter will call us back once she has made decision.   Please feel free to call with any hospice questions or concerns.   Yolande Jolly, BSN, [facility] (in [place]) [phone] (24Hr line)

## 2020-02-03 NOTE — Consult Note (Signed)
Cardiology Consultation:   Patient ID: Isabel Zamora MRN: [id number]; DOB: 04-20-1929  Admit date: 02/03/2020 Date of Consult: 02/03/2020  Primary Care Provider: Oneita Hurt No [facility] Cardiologist: New to Dr. [facility] Electrophysiologist:  None    Patient Profile:   Isabel Zamora is a 86 y.o. female with a hx of HTN, HLD, dementia, hypothyroidism, post-polio syndrome, cerebral aneurysm, DNR who is being seen today for the evaluation of abnormal EKG/elevated troponin at the request of Dr. Ophelia Charter.  History of Present Illness:   She was recently in the hospital 10/15-10/18 for acute metabolic encephalopathy and AKI eventually attributed to a UTI. Per notes she had had right sided weakness, expressive aphasia, left sided gaze. Brain imaging was unremarkable. She also had superimposed hospital-acquired delirium. PT/OT recommended SNF but daughter preferred ALF.  This morning she tripped and fell when her feet got tangled and she injured her head with a head laceration. CT head showed atrophy and chronic microvascular ischemic changes, CXR NAD. She was a poor historian so labwork and EKG were obtained. Labs show hsTroponin 111->106, hypokalemia of 3.4, elevated AST 51, mild anemia with Hgb 11.2, hypoalbuminemia of 2.9. Cardiology was contacted for consultation given abnormal EKG and elevated troponin. Patient was seen in ED by palliative team and daughter/patient declined admission/invasive workup but requested cardiology consult prior to leaving.   The patient specifically denies any chest pain, dyspnea at rest or with exertion, orthopnea, paroxysmal nocturnal dyspnea, syncope, palpitations, new focal neurological deficits, intermittent claudication, lower extremity edema, unexplained weight gain, cough, hemoptysis or wheezing.    Past Medical History:  Diagnosis Date  . Dementia (HCC)   . HLD (hyperlipidemia)   . HTN (hypertension)   . Hypothyroidism   . Kyphosis      History reviewed. No pertinent surgical history.   Home Medications:  Prior to Admission medications   Medication Sig Start Date End Date Taking? Authorizing Provider  acetaminophen (TYLENOL) 500 MG tablet Take 1 tablet (500 mg total) by mouth every 6 (six) hours as needed for mild pain or fever. 02/02/20  Yes Pahwani, Daleen Bo, MD  aspirin EC 81 MG tablet Take 81 mg by mouth daily. Swallow whole.   Yes [provider]  cholecalciferol (VITAMIN D3) 25 MCG (1000 UNIT) tablet Take 1,000 Units by mouth daily.   Yes [provider]  donepezil (ARICEPT) 10 MG tablet Take 10 mg by mouth at bedtime. 01/13/20  Yes [provider]  Ensure (ENSURE) Take 237 mLs by mouth daily as needed (for missed meal).   Yes [provider]  levothyroxine (SYNTHROID) 25 MCG tablet Take 25 mcg by mouth every Monday, Wednesday, and Friday.  01/28/20  Yes [provider]  levothyroxine (SYNTHROID) 50 MCG tablet Take 50 mcg by mouth every Tuesday, Thursday, Saturday, and Sunday.   Yes [provider]  mirtazapine (REMERON) 7.5 MG tablet Take 7.5 mg by mouth at bedtime. 01/13/20  Yes [provider]  Multiple Vitamins-Minerals (MULTIVITAMIN ADULT) CHEW Chew 1 tablet by mouth in the morning. 02/02/20 03/03/20 Yes Hughie Closs, MD    Inpatient Medications: Scheduled Meds: . potassium chloride  40 mEq Oral Once   Continuous Infusions:  PRN Meds:   Allergies:    Allergies  Allergen Reactions  . Morphine And Related   . Penicillins     Social History:   Social History   Socioeconomic History  . Marital status: Unknown    Spouse name: Not on file  . Number of children: Not  on file  . Years of education: Not on file  . Highest education level: Not on file  Occupational History  . Not on file  Tobacco Use  . Smoking status: Never Smoker  . Smokeless tobacco: Never Used  Substance and Sexual Activity  . Alcohol use: Never  . Drug use: Never  .  Sexual activity: Not Currently  Other Topics Concern  . Not on file  Social History Narrative  . Not on file   Social Determinants of Health   Financial Resource Strain:   . Difficulty of Paying Living Expenses: Not on file  Food Insecurity:   . Worried About Programme researcher, broadcasting/film/video in the Last Year: Not on file  . Ran Out of Food in the Last Year: Not on file  Transportation Needs:   . Lack of Transportation (Medical): Not on file  . Lack of Transportation (Non-Medical): Not on file  Physical Activity:   . Days of Exercise per Week: Not on file  . Minutes of Exercise per Session: Not on file  Stress:   . Feeling of Stress : Not on file  Social Connections:   . Frequency of Communication with Friends and Family: Not on file  . Frequency of Social Gatherings with Friends and Family: Not on file  . Attends Religious Services: Not on file  . Active Member of Clubs or Organizations: Not on file  . Attends Banker Meetings: Not on file  . Marital Status: Not on file  Intimate Partner Violence:   . Fear of Current or Ex-Partner: Not on file  . Emotionally Abused: Not on file  . Physically Abused: Not on file  . Sexually Abused: Not on file    Family History:   No family history off CAD, PAD, but her father had strokes. Mom died young of TB.   ROS:  Please see the history of present illness.  Denies angina or dyspnea, palpitations, orthopnea/PND, edema, cough, fever, chills, syncope. All other ROS reviewed and negative.     Physical Exam/Data:   Vitals:   02/03/20 1630 02/03/20 1645 02/03/20 1700 02/03/20 1715  BP: 132/77 139/67 (!) 143/65 (!) 141/74  Pulse: 97 91 90 95  Resp: 18 19 (!) 28 (!) 23  Temp:      TempSrc:      SpO2: 97% 95% 95% 96%   No intake or output data in the 24 hours ending 02/03/20 1757 Last 3 Weights 08/11/2018  Weight (lbs) 107 lb  Weight (kg) 48.535 kg     There is no height or weight on file to calculate BMI.  General:  Well  nourished, well developed, in no acute distress HEENT: normal Lymph: no adenopathy Neck: no JVD Endocrine:  No thryomegaly Vascular: No carotid bruits; FA pulses 2+ bilaterally without bruits  Cardiac:  normal S1, S2; RRR; no murmur  Lungs:  clear to auscultation bilaterally, no wheezing, rhonchi or rales  Abd: soft, nontender, no hepatomegaly  Ext: no edema Musculoskeletal:  No deformities, BUE and BLE strength normal and equal Skin: warm and dry  Neuro:  CNs 2-12 intact, no focal abnormalities noted Psych:  Normal affect   EKG:  The EKG was personally reviewed and demonstrates:  NSR, Q waves/absent R wave progression from V1-V4 with striking new ST elevation and deep symmetrical T wave inversion V1-V5 and prolonged QTc 506 ms.  Minimal flat elevation in hs troponin I. Telemetry:  Telemetry was personally reviewed and demonstrates:  NSR  Relevant CV  Studies: None  Laboratory Data:  High Sensitivity Troponin:   Recent Labs  Lab 02/03/20 1034 02/03/20 1455  TROPONINIHS 111* 106*     Chemistry Recent Labs  Lab 02/01/20 0217 02/02/20 0825 02/03/20 1034  NA 141 139 140  K 3.0* 4.0 3.4*  CL 107 109 107  CO2 18* 17* 24  GLUCOSE 80 87 112*  BUN 9 5* 6*  CREATININE 0.87 0.80 0.67  CALCIUM 8.7* 8.7* 9.4  GFRNONAA 59* >60 >60  ANIONGAP 16* 13 9    Recent Labs  Lab 01/30/20 1107 02/03/20 1034  PROT 7.2 6.4*  ALBUMIN 3.8 2.9*  AST 38 51*  ALT 21 26  ALKPHOS 82 64  BILITOT 0.7 0.9   Hematology Recent Labs  Lab 02/01/20 0217 02/02/20 0825 02/03/20 1034  WBC 11.3* 9.4 7.9  RBC 3.56* 3.77* 3.75*  HGB 10.8* 11.5* 11.2*  HCT 33.8* 35.2* 34.6*  MCV 94.9 93.4 92.3  MCH 30.3 30.5 29.9  MCHC 32.0 32.7 32.4  RDW 14.5 14.5 14.5  PLT 275 272 282   BNPNo results for input(s): BNP, PROBNP in the last 168 hours.  DDimer No results for input(s): DDIMER in the last 168 hours.   Radiology/Studies:  CT Head Wo Contrast  Result Date: 02/03/2020 CLINICAL DATA:  Fall.   Head trauma. EXAM: CT HEAD WITHOUT CONTRAST CT CERVICAL SPINE WITHOUT CONTRAST TECHNIQUE: Multidetector CT imaging of the head and cervical spine was performed following the standard protocol without intravenous contrast. Multiplanar CT image reconstructions of the cervical spine were also generated. COMPARISON:  CT head 01/30/2020 FINDINGS: CT HEAD FINDINGS Brain: No acute intracranial injury. Negative for intracranial hemorrhage. Generalized atrophy. Stable ventricular enlargement. Diffuse white matter hypodensity bilaterally unchanged. No acute infarct or mass. Vascular: Negative for hyperdense vessel Skull: Negative for skull fracture. Sinuses/Orbits: Paranasal sinuses clear. Bilateral cataract extraction. Other: None CT CERVICAL SPINE FINDINGS Alignment: Mild retrolisthesis C3-4 and C4-5. Mild-to-moderate levoscoliosis. Skull base and vertebrae: No fracture identified Soft tissues and spinal canal: No soft tissue mass or swelling. 9 mm right thyroid nodule. No further imaging necessary. Disc levels: Multilevel disc degeneration and spurring. Right foraminal narrowing C3-4, C4-5, C5-6 due to spurring. Upper chest: Right upper lobe airspace disease and adjacent to the major fissure. This is partially imaged. Left upper lobe is clear. Other: None IMPRESSION: 1. Atrophy and chronic microvascular ischemic changes in the white matter. No acute intracranial abnormality 2. Cervical scoliosis and spondylosis.  Negative for fracture 3. Right upper lobe airspace disease incompletely imaged. Recommend follow-up chest x-ray. Electronically Signed   By: Marlan Palau M.D.   On: 02/03/2020 11:24   CT Cervical Spine Wo Contrast  Result Date: 02/03/2020 CLINICAL DATA:  Fall.  Head trauma. EXAM: CT HEAD WITHOUT CONTRAST CT CERVICAL SPINE WITHOUT CONTRAST TECHNIQUE: Multidetector CT imaging of the head and cervical spine was performed following the standard protocol without intravenous contrast. Multiplanar CT image  reconstructions of the cervical spine were also generated. COMPARISON:  CT head 01/30/2020 FINDINGS: CT HEAD FINDINGS Brain: No acute intracranial injury. Negative for intracranial hemorrhage. Generalized atrophy. Stable ventricular enlargement. Diffuse white matter hypodensity bilaterally unchanged. No acute infarct or mass. Vascular: Negative for hyperdense vessel Skull: Negative for skull fracture. Sinuses/Orbits: Paranasal sinuses clear. Bilateral cataract extraction. Other: None CT CERVICAL SPINE FINDINGS Alignment: Mild retrolisthesis C3-4 and C4-5. Mild-to-moderate levoscoliosis. Skull base and vertebrae: No fracture identified Soft tissues and spinal canal: No soft tissue mass or swelling. 9 mm right thyroid nodule. No further imaging necessary.  Disc levels: Multilevel disc degeneration and spurring. Right foraminal narrowing C3-4, C4-5, C5-6 due to spurring. Upper chest: Right upper lobe airspace disease and adjacent to the major fissure. This is partially imaged. Left upper lobe is clear. Other: None IMPRESSION: 1. Atrophy and chronic microvascular ischemic changes in the white matter. No acute intracranial abnormality 2. Cervical scoliosis and spondylosis.  Negative for fracture 3. Right upper lobe airspace disease incompletely imaged. Recommend follow-up chest x-ray. Electronically Signed   By: Marlan Palauharles  Clark M.D.   On: 02/03/2020 11:24   DG Chest Portable 1 View  Result Date: 02/03/2020 CLINICAL DATA:  Syncope EXAM: PORTABLE CHEST 1 VIEW COMPARISON:  01/30/2020 FINDINGS: Patchy infiltrate within the right mid lung zone has resolved. The lungs are clear. No pneumothorax or pleural effusion. Cardiac size within normal limits. Pulmonary vascularity is normal. Moderate to severe thoracolumbar scoliosis is again noted with resultant thoracic deformity. IMPRESSION: No active disease. Electronically Signed   By: Helyn NumbersAshesh  Parikh MD   On: 02/03/2020 12:10     Assessment and Plan:   1. Reported  mechanical fall with head laceration 2. Abnormal EKG/elevated troponin 3. Hypokalemia, will replete in ED 4. Hx of dementia  The patient has no CV symptoms whatsoever and troponin increase is trivial and not c/w an acute coronary event. The ECG changes are striking, but are seen in an area with preexisting Q waves. Differential diagnosis is LAD territory ischemia (peri-infarction ischemia) versus takotsubo sd (stress cardiomyopathy from recent admission/infection?).  The patient and her daughter are firmly opposed to any invasive procedures. An urgent echo would not help distinguish CAD from takotsubo sd. She agrees to take aspirin, but refuses statins (multiple attempts in the past, stopped due to side effects) or beta blockers (reported sensitivity to these medications).  She is not experiencing angina and has no dyspnea or signs of CHF. Nitrates and diuretics would not be expected to offer relief.  The cognitive and functional limitations from post-polio sd and dementia are the major limitations to her quality of life. They are interested in palliative care and are considering hospice. [facility]  They did agree to an outpatient echo (a delayed echo would actually be more informative) and follow up. Will schedule that.     For questions or updates, please contact [facility] Please consult [email or web address] for contact info under    Thurmon FairMihai Vinayak Bobier, MD, [facility] [phone] office [phone] pager

## 2020-02-04 LAB — CULTURE, BLOOD (ROUTINE X 2)
Culture: NO GROWTH
Culture: NO GROWTH

## 2020-02-05 LAB — URINE CULTURE: Culture: <10000 — AB

## 2020-02-05 NOTE — Progress Notes (Signed)
Palliative Medicine RN Note: When filing MOST form, noticed that it was not signed by provider, so it cannot be filed as a MOST. I have reached out to [employer], [facility] ([facility] who will be following for hospice care, to request their help in completing it.  Margret Chance Andren Bethea, RN, BSN, [facility] Palliative Medicine Team 02/05/2020 8:16 AM Office [phone]

## 2020-02-11 ENCOUNTER — Other Ambulatory Visit: Payer: Self-pay | Admitting: Physician Assistant

## 2020-02-11 ENCOUNTER — Telehealth (HOSPITAL_COMMUNITY): Payer: Self-pay | Admitting: Cardiovascular Disease

## 2020-02-11 DIAGNOSIS — R9431 Abnormal electrocardiogram [ECG] [EKG]: Secondary | ICD-10-CM

## 2020-02-11 DIAGNOSIS — R778 Other specified abnormalities of plasma proteins: Secondary | ICD-10-CM
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
## 2020-03-16 ENCOUNTER — Emergency Department (HOSPITAL_COMMUNITY): Payer: Medicare Other

## 2020-03-16 ENCOUNTER — Inpatient Hospital Stay (HOSPITAL_COMMUNITY)
Admission: EM | Admit: 2020-03-16 | Discharge: 2020-03-18 | DRG: 101 | Disposition: A | Discharge status: Home Health | Payer: Medicare Other | Source: Skilled Nursing Facility | Attending: Family Medicine | Admitting: Family Medicine

## 2020-03-16 DIAGNOSIS — E785 Hyperlipidemia, unspecified: Secondary | ICD-10-CM | POA: Diagnosis present

## 2020-03-16 DIAGNOSIS — W19XXXA Unspecified fall, initial encounter: Secondary | ICD-10-CM | POA: Diagnosis present

## 2020-03-16 DIAGNOSIS — F0390 Unspecified dementia without behavioral disturbance: Secondary | ICD-10-CM | POA: Diagnosis present

## 2020-03-16 DIAGNOSIS — G14 Postpolio syndrome: Secondary | ICD-10-CM | POA: Diagnosis present

## 2020-03-16 DIAGNOSIS — Z885 Allergy status to narcotic agent status: Secondary | ICD-10-CM | POA: Diagnosis not present

## 2020-03-16 DIAGNOSIS — I252 Old myocardial infarction: Secondary | ICD-10-CM | POA: Diagnosis not present

## 2020-03-16 DIAGNOSIS — E876 Hypokalemia: Secondary | ICD-10-CM | POA: Diagnosis present

## 2020-03-16 DIAGNOSIS — I7389 Other specified peripheral vascular diseases: Secondary | ICD-10-CM | POA: Diagnosis present

## 2020-03-16 DIAGNOSIS — R569 Unspecified convulsions: Secondary | ICD-10-CM | POA: Diagnosis present

## 2020-03-16 DIAGNOSIS — N179 Acute kidney failure, unspecified: Secondary | ICD-10-CM | POA: Diagnosis present

## 2020-03-16 DIAGNOSIS — Z880 Allergy status to penicillin: Secondary | ICD-10-CM | POA: Diagnosis not present

## 2020-03-16 DIAGNOSIS — Z66 Do not resuscitate: Secondary | ICD-10-CM | POA: Diagnosis present

## 2020-03-16 DIAGNOSIS — Z79899 Other long term (current) drug therapy: Secondary | ICD-10-CM

## 2020-03-16 DIAGNOSIS — I10 Essential (primary) hypertension: Secondary | ICD-10-CM | POA: Diagnosis present

## 2020-03-16 DIAGNOSIS — Z7982 Long term (current) use of aspirin: Secondary | ICD-10-CM | POA: Diagnosis not present

## 2020-03-16 DIAGNOSIS — E872 Acidosis, unspecified: Secondary | ICD-10-CM

## 2020-03-16 DIAGNOSIS — G4089 Other seizures: Secondary | ICD-10-CM | POA: Diagnosis present

## 2020-03-16 DIAGNOSIS — Z20822 Contact with and (suspected) exposure to covid-19: Secondary | ICD-10-CM | POA: Diagnosis present

## 2020-03-16 DIAGNOSIS — Z79890 Hormone replacement therapy (postmenopausal): Secondary | ICD-10-CM | POA: Diagnosis not present

## 2020-03-16 DIAGNOSIS — D72829 Elevated white blood cell count, unspecified: Secondary | ICD-10-CM | POA: Diagnosis present

## 2020-03-16 DIAGNOSIS — E039 Hypothyroidism, unspecified: Secondary | ICD-10-CM | POA: Diagnosis present

## 2020-03-16 DIAGNOSIS — Z9181 History of falling: Secondary | ICD-10-CM | POA: Diagnosis not present

## 2020-03-16 HISTORY — DX: Scoliosis, unspecified: M41.9

## 2020-03-16 LAB — RAPID URINE DRUG SCREEN, HOSP PERFORMED
Amphetamines: NOT DETECTED
Barbiturates: NOT DETECTED
Benzodiazepines: POSITIVE — AB
Cocaine: NOT DETECTED
Opiates: NOT DETECTED
Tetrahydrocannabinol: NOT DETECTED

## 2020-03-16 LAB — I-STAT VENOUS BLOOD GAS, ED
Acid-base deficit: 14 mmol/L — ABNORMAL HIGH (ref 0.0–2.0)
Bicarbonate: 15.2 mmol/L — ABNORMAL LOW (ref 20.0–28.0)
Calcium, Ion: 1.17 mmol/L (ref 1.15–1.40)
HCT: 41 % (ref 36.0–46.0)
Hemoglobin: 13.9 g/dL (ref 12.0–15.0)
O2 Saturation: 83 %
Potassium: 3.8 mmol/L (ref 3.5–5.1)
Sodium: 137 mmol/L (ref 135–145)
TCO2: 17 mmol/L — ABNORMAL LOW (ref 22–32)
pCO2, Ven: 46.2 mmHg (ref 44.0–60.0)
pH, Ven: 7.126 — CL (ref 7.250–7.430)
pO2, Ven: 63 mmHg — ABNORMAL HIGH (ref 32.0–45.0)

## 2020-03-16 LAB — COMPREHENSIVE METABOLIC PANEL
ALT: 26 U/L (ref 0–44)
AST: 46 U/L — ABNORMAL HIGH (ref 15–41)
Albumin: 3.4 g/dL — ABNORMAL LOW (ref 3.5–5.0)
Alkaline Phosphatase: 90 U/L (ref 38–126)
Anion gap: 25 — ABNORMAL HIGH (ref 5–15)
BUN: 16 mg/dL (ref 8–23)
CO2: 14 mmol/L — ABNORMAL LOW (ref 22–32)
Calcium: 9.2 mg/dL (ref 8.9–10.3)
Chloride: 98 mmol/L (ref 98–111)
Creatinine, Ser: 1.21 mg/dL — ABNORMAL HIGH (ref 0.44–1.00)
GFR, Estimated: 43 mL/min — ABNORMAL LOW (ref >60)
Glucose, Bld: 216 mg/dL — ABNORMAL HIGH (ref 70–99)
Potassium: 3.2 mmol/L — ABNORMAL LOW (ref 3.5–5.1)
Sodium: 137 mmol/L (ref 135–145)
Total Bilirubin: 0.6 mg/dL (ref 0.3–1.2)
Total Protein: 7 g/dL (ref 6.5–8.1)

## 2020-03-16 LAB — ACETAMINOPHEN LEVEL: Acetaminophen (Tylenol), Serum: <10 ug/mL — ABNORMAL LOW (ref 10–30)

## 2020-03-16 LAB — I-STAT CHEM 8, ED
BUN: 22 mg/dL (ref 8–23)
Calcium, Ion: 1.16 mmol/L (ref 1.15–1.40)
Chloride: 102 mmol/L (ref 98–111)
Creatinine, Ser: 0.9 mg/dL (ref 0.44–1.00)
Glucose, Bld: 208 mg/dL — ABNORMAL HIGH (ref 70–99)
HCT: 41 % (ref 36.0–46.0)
Hemoglobin: 13.9 g/dL (ref 12.0–15.0)
Potassium: 3.8 mmol/L (ref 3.5–5.1)
Sodium: 137 mmol/L (ref 135–145)
TCO2: 16 mmol/L — ABNORMAL LOW (ref 22–32)

## 2020-03-16 LAB — URINALYSIS, ROUTINE W REFLEX MICROSCOPIC
Bacteria, UA: NONE SEEN
Bilirubin Urine: NEGATIVE
Glucose, UA: NEGATIVE mg/dL
Ketones, ur: NEGATIVE mg/dL
Leukocytes,Ua: NEGATIVE
Nitrite: NEGATIVE
Protein, ur: 100 mg/dL — AB
Specific Gravity, Urine: 1.016 (ref 1.005–1.030)
pH: 5 (ref 5.0–8.0)

## 2020-03-16 LAB — CBC WITH DIFFERENTIAL/PLATELET
Abs Immature Granulocytes: 0.21 10*3/uL — ABNORMAL HIGH (ref 0.00–0.07)
Basophils Absolute: 0.1 10*3/uL (ref 0.0–0.1)
Basophils Relative: 1 %
Eosinophils Absolute: 0.1 10*3/uL (ref 0.0–0.5)
Eosinophils Relative: 1 %
HCT: 40.9 % (ref 36.0–46.0)
Hemoglobin: 12.5 g/dL (ref 12.0–15.0)
Immature Granulocytes: 2 %
Lymphocytes Relative: 28 %
Lymphs Abs: 3.4 10*3/uL (ref 0.7–4.0)
MCH: 30.3 pg (ref 26.0–34.0)
MCHC: 30.6 g/dL (ref 30.0–36.0)
MCV: 99.3 fL (ref 80.0–100.0)
Monocytes Absolute: 0.5 10*3/uL (ref 0.1–1.0)
Monocytes Relative: 4 %
Neutro Abs: 7.9 10*3/uL — ABNORMAL HIGH (ref 1.7–7.7)
Neutrophils Relative %: 64 %
Platelets: 355 10*3/uL (ref 150–400)
RBC: 4.12 MIL/uL (ref 3.87–5.11)
RDW: 14.1 % (ref 11.5–15.5)
WBC: 12.2 10*3/uL — ABNORMAL HIGH (ref 4.0–10.5)
nRBC: 0 % (ref 0.0–0.2)

## 2020-03-16 LAB — RESP PANEL BY RT-PCR (FLU A&B, COVID) ARPGX2
Influenza A by PCR: NEGATIVE
Influenza B by PCR: NEGATIVE
SARS Coronavirus 2 by RT PCR: NEGATIVE

## 2020-03-16 LAB — MAGNESIUM: Magnesium: 2 mg/dL (ref 1.7–2.4)

## 2020-03-16 LAB — SALICYLATE LEVEL: Salicylate Lvl: <7 mg/dL — ABNORMAL LOW (ref 7.0–30.0)

## 2020-03-16 LAB — LACTIC ACID, PLASMA
Lactic Acid, Venous: >11 mmol/L (ref 0.5–1.9)
Lactic Acid, Venous: 8.7 mmol/L (ref 0.5–1.9)

## 2020-03-16 LAB — TSH: TSH: 3.808 u[IU]/mL (ref 0.350–4.500)

## 2020-03-16 IMAGING — CT CT HEAD W/O CM
4 series · 16 of 47 positions shown, 18 images · non-contrast
Comparison: None.

CLINICAL DATA: Seizure and fall

EXAM:
CT HEAD WITHOUT CONTRAST
CT CERVICAL SPINE WITHOUT CONTRAST
TECHNIQUE: Multidetector CT imaging of the head and cervical spine was
performed following the standard protocol without intravenous
contrast. Multiplanar CT image reconstructions of the cervical spine
were also generated.

[Series 3: head wo · axial · 0.49mm/px · z∈[-202,-82]mm · 7 of 34 slices shown, 9 images]
[im 5/34  brain]
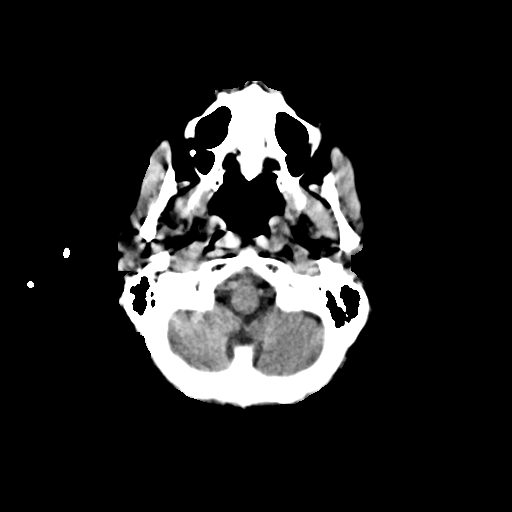
[im 5/34  bone]
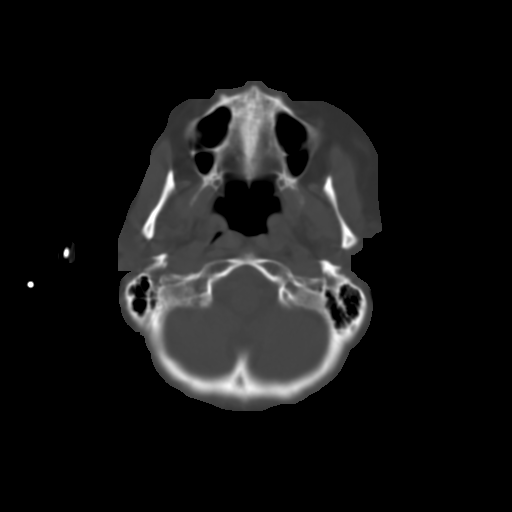
[im 9/34  brain]
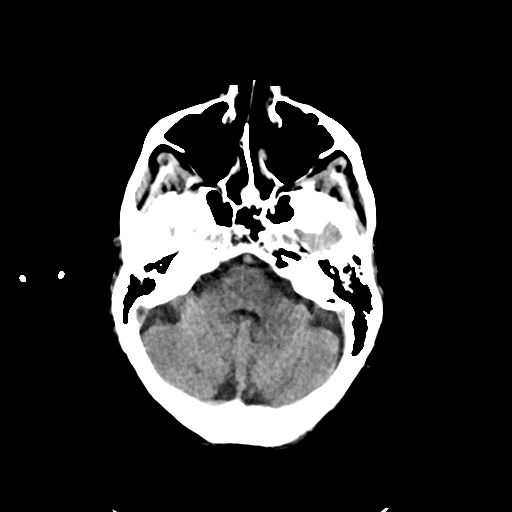
[im 13/34  brain]
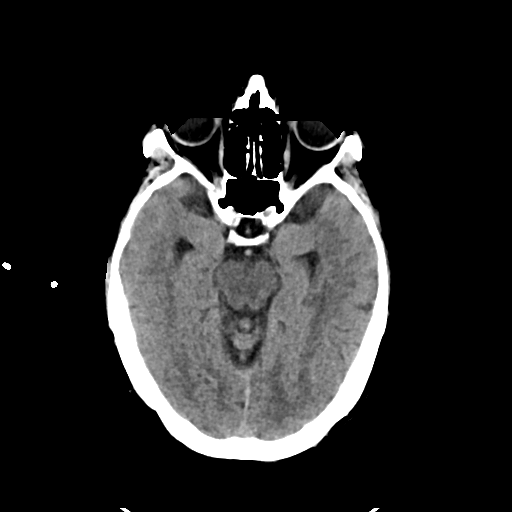
[im 17/34  brain]
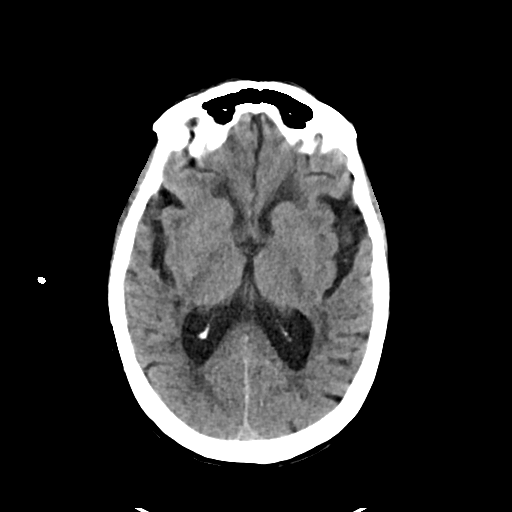
[im 21/34  brain]
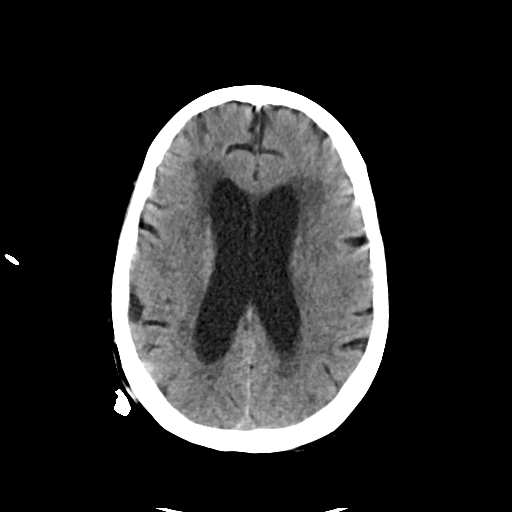
[im 21/34  bone]
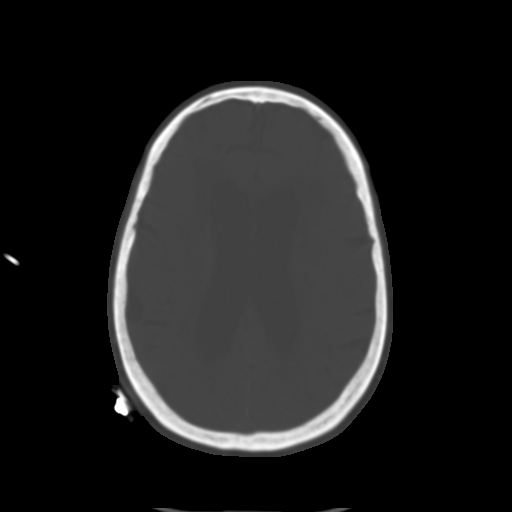
[im 25/34  brain]
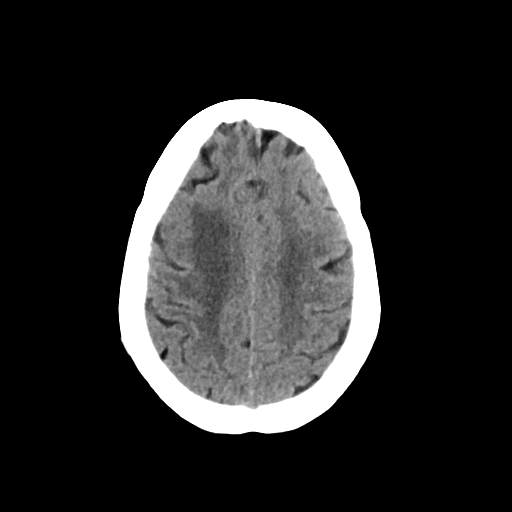
[im 29/34  brain]
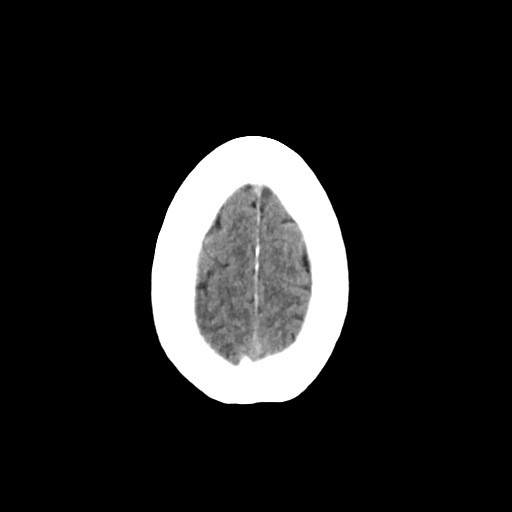

[Series 4: head bone · axial · 0.49mm/px · z∈[-206,-174]mm · 3 of 83 slices shown]
[im 9/83  bone]
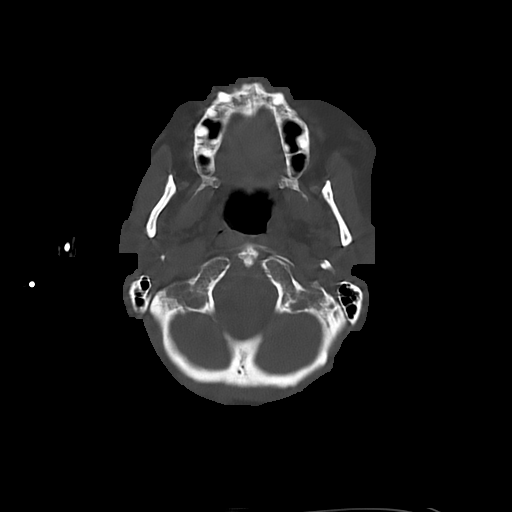
[im 17/83  bone]
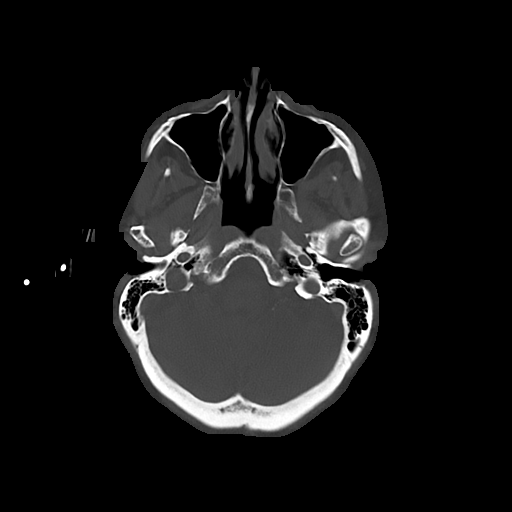
[im 25/83  bone]
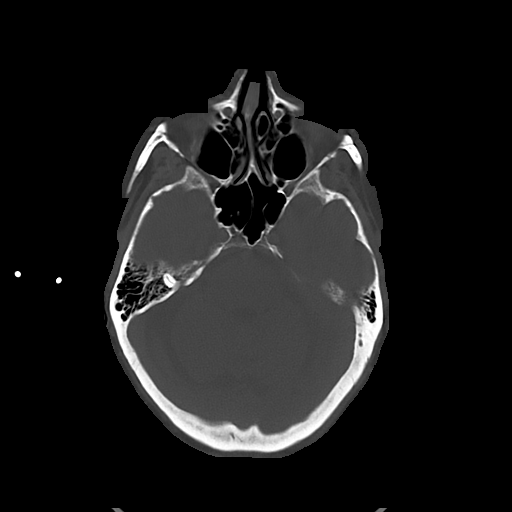

[Series 5: cor soft · coronal · 0.32mm/px · 3 of 72 slices shown]
[im 24/72  brain]
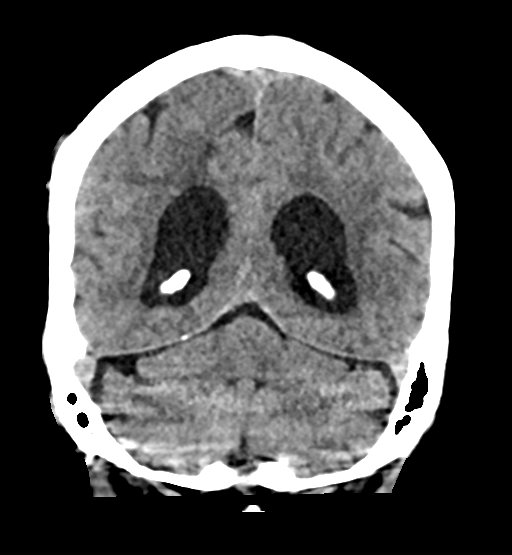
[im 32/72  brain]
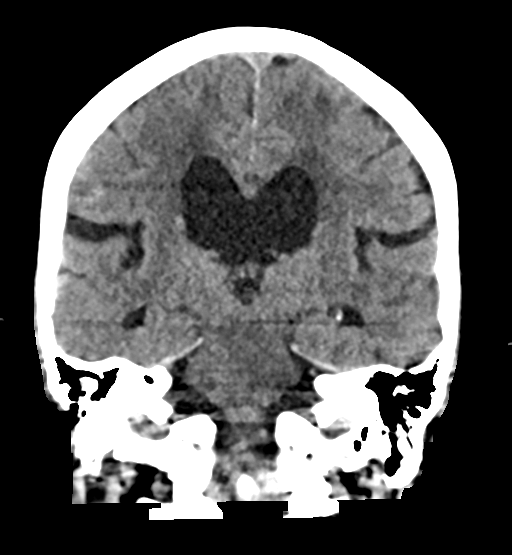
[im 40/72  brain]
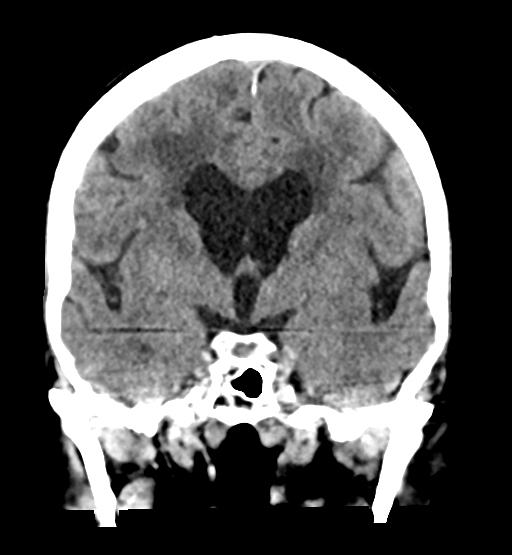

[Series 6: sag soft · sagittal · 0.35mm/px · 3 of 56 slices shown]
[im 19/56  brain]
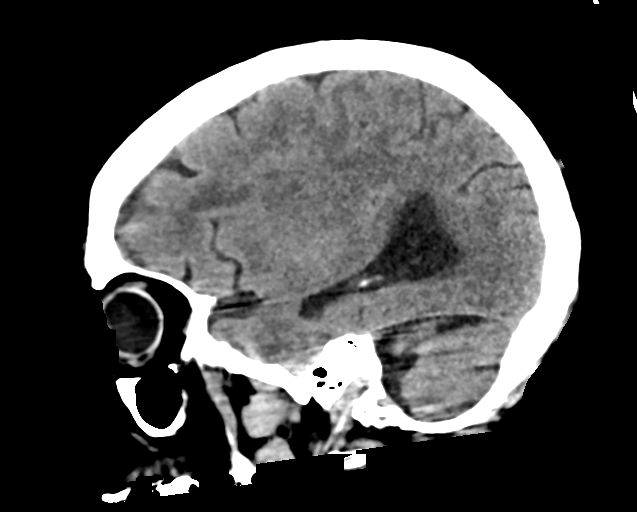
[im 28/56  brain]
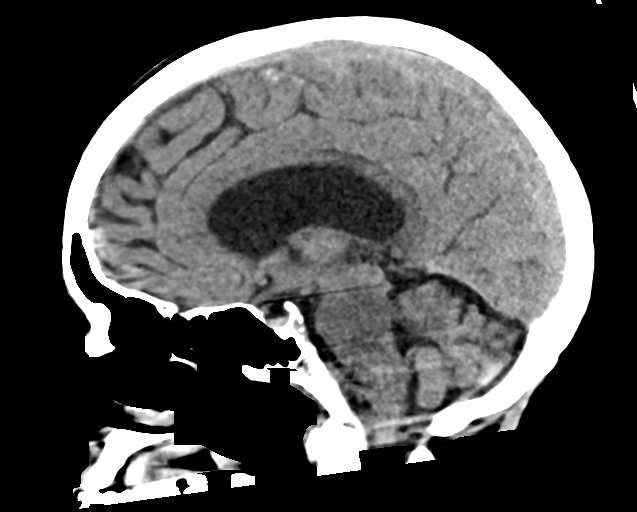
[im 37/56  brain]
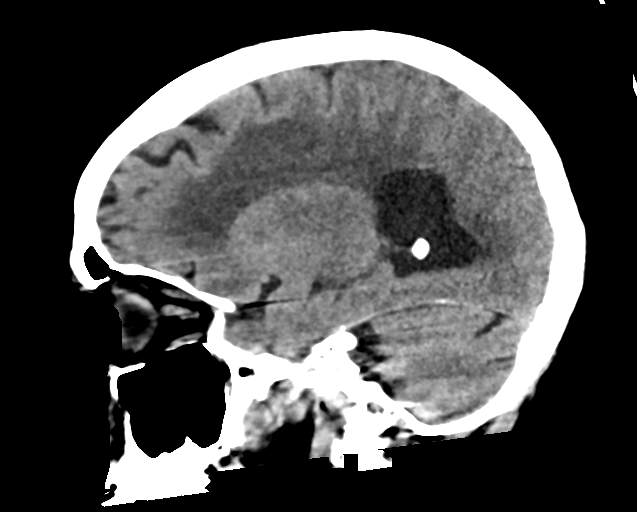

[16 of 47 positions shown; findings below may reference images not displayed]

FINDINGS: CT HEAD FINDINGS

Brain: There is no mass, hemorrhage or extra-axial collection. There
is generalized atrophy without lobar predilection. There is
hypoattenuation of the periventricular white matter, most commonly
indicating chronic ischemic microangiopathy.

Vascular: No abnormal hyperdensity of the major intracranial
arteries or dural venous sinuses. No intracranial atherosclerosis.

Skull: The visualized skull base, calvarium and extracranial soft
tissues are normal.

Sinuses/Orbits: No fluid levels or advanced mucosal thickening of
the visualized paranasal sinuses. No mastoid or middle ear effusion.
The orbits are normal.

CT CERVICAL SPINE FINDINGS

Alignment: No static subluxation. Facets are aligned. Occipital
condyles are normally positioned.

Skull base and vertebrae: No acute fracture.

Soft tissues and spinal canal: No prevertebral fluid or swelling. No
visible canal hematoma.

Disc levels: No advanced spinal canal or neural foraminal stenosis.
Multilevel degenerative disc disease.

Upper chest: No pneumothorax, pulmonary nodule or pleural effusion.

Other: Normal visualized paraspinal cervical soft tissues.
IMPRESSION: 1. Chronic ischemic microangiopathy and generalized atrophy without
acute intracranial abnormality.
2. No acute fracture or static subluxation of the cervical spine.

## 2020-03-16 IMAGING — CT CT CERVICAL SPINE W/O CM
3 of 5 series · 11 of 35 positions shown, 13 images · non-contrast
Comparison: None.

CLINICAL DATA: Seizure and fall

EXAM:
CT HEAD WITHOUT CONTRAST
CT CERVICAL SPINE WITHOUT CONTRAST
TECHNIQUE: Multidetector CT imaging of the head and cervical spine was
performed following the standard protocol without intravenous
contrast. Multiplanar CT image reconstructions of the cervical spine
were also generated.

[Series 7: sag bone · sagittal · 0.25mm/px · 5 of 64 slices shown, 6 images]
[im 22/64  bone]
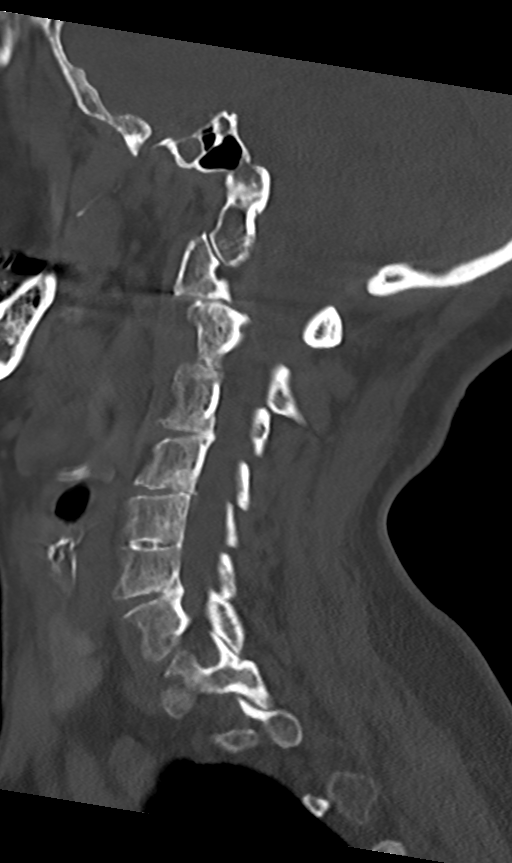
[im 27/64  bone]
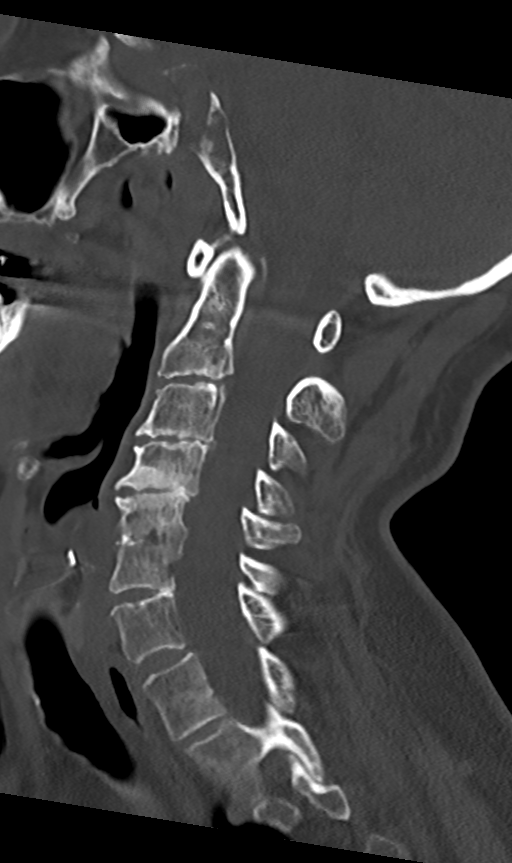
[im 32/64  soft-tissue]
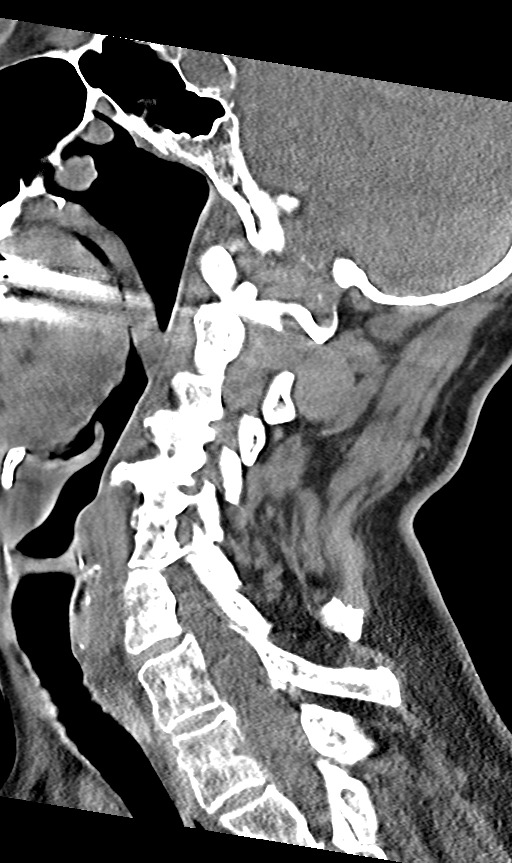
[im 32/64  bone]
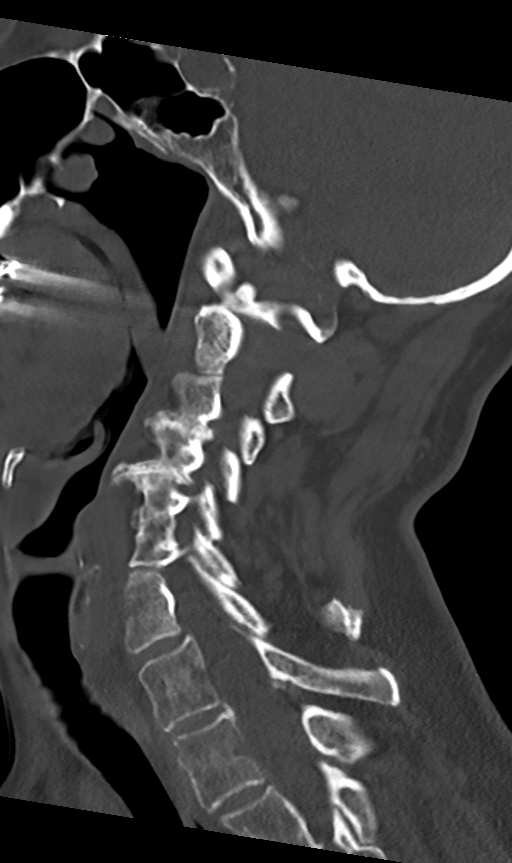
[im 37/64  bone]
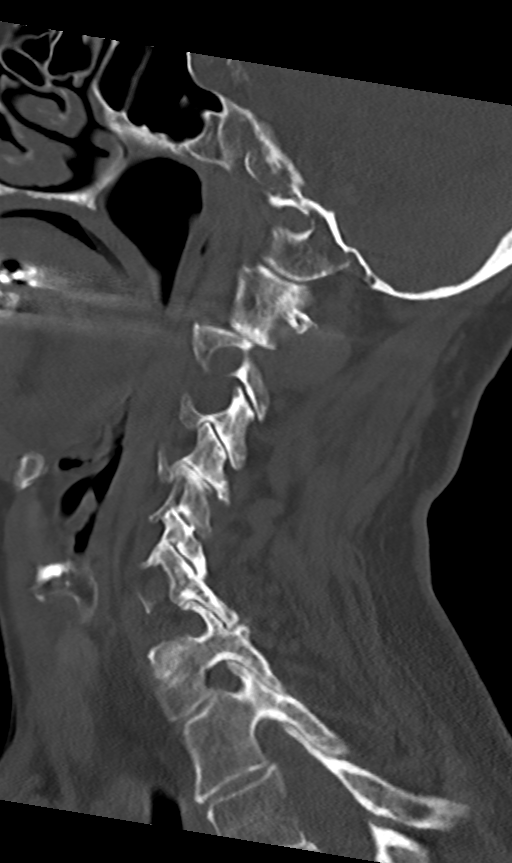
[im 43/64  bone]
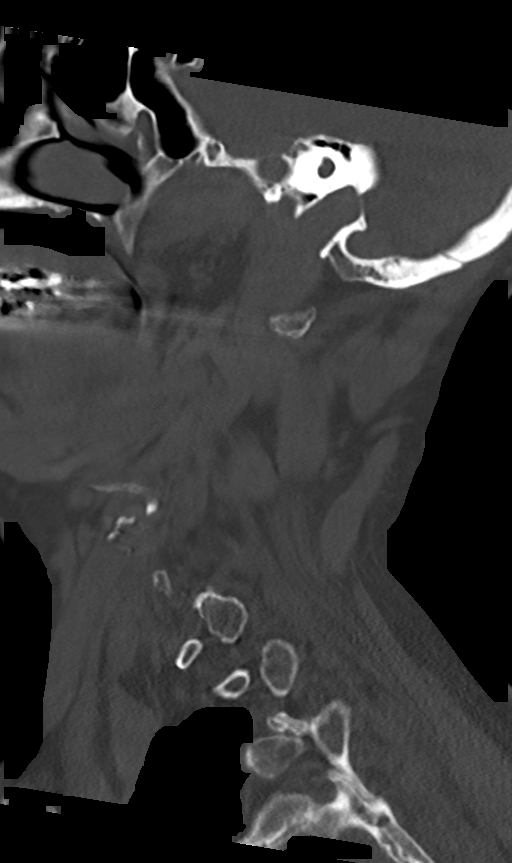

[Series 8: cor bone · coronal · 0.29mm/px · 3 of 76 slices shown (1 of 2)]
[im 16/76  bone]
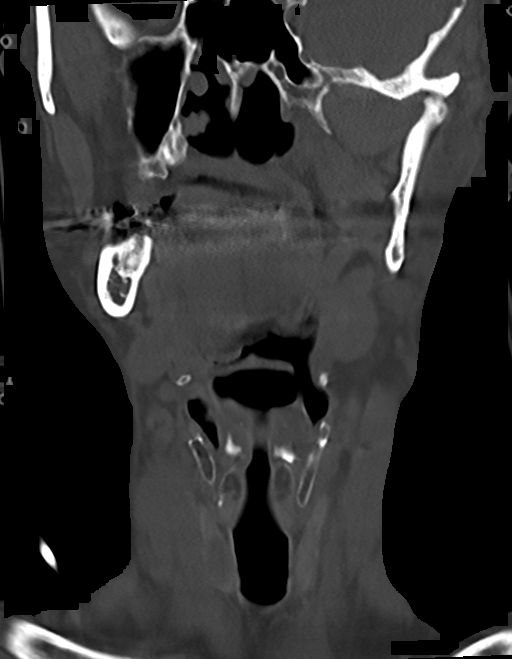
[im 31/76  bone]
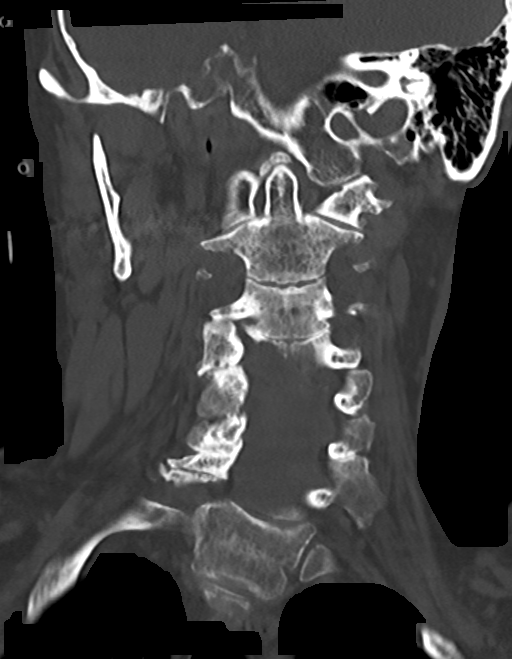
[im 46/76  bone]
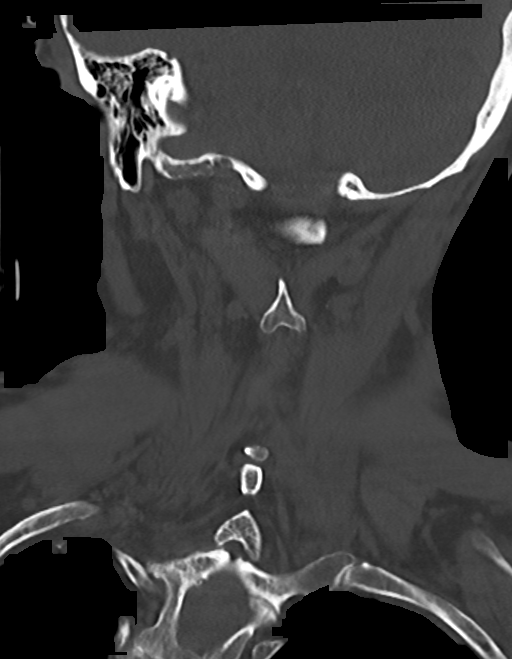

[Series 11: cor bone · axial · 0.25mm/px · z∈[-309,-199]mm · 3 of 114 slices shown, 4 images (2 of 2)]
[im 29/114  soft-tissue]
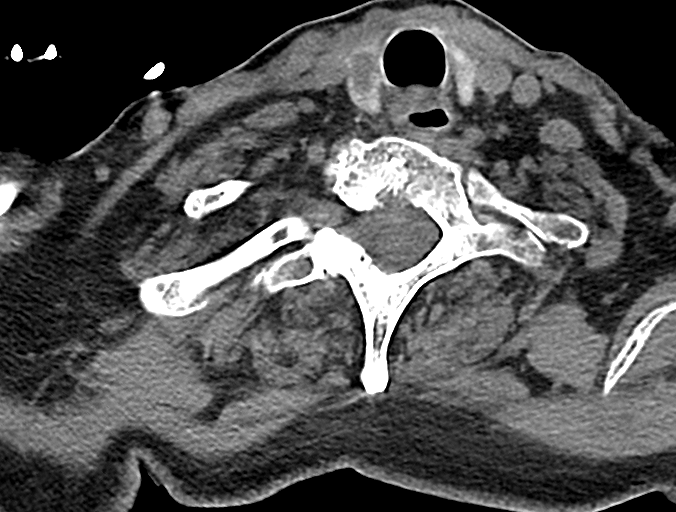
[im 29/114  bone]
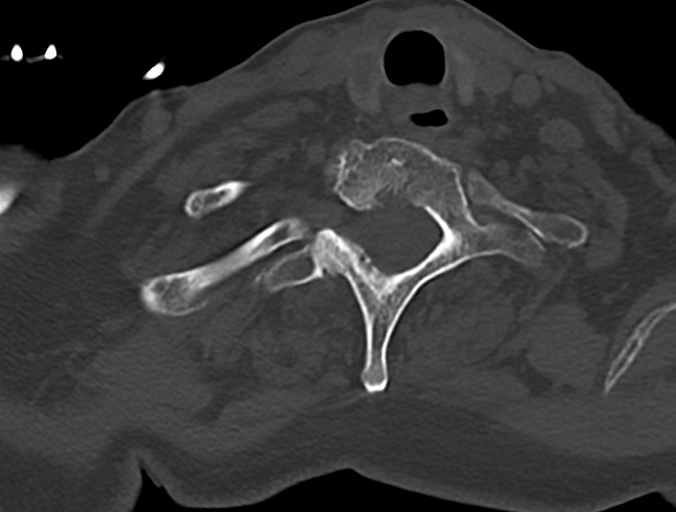
[im 57/114  bone]
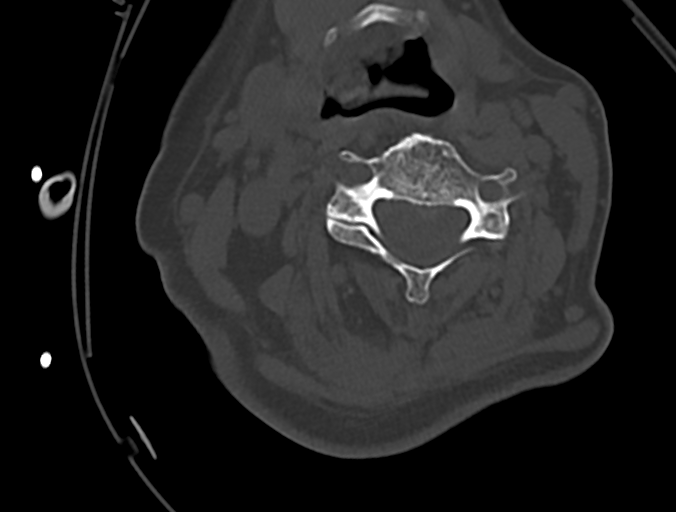
[im 85/114  bone]
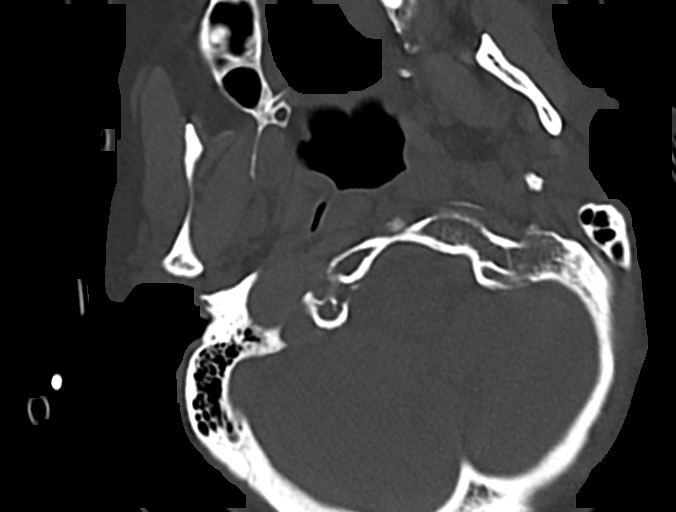

[11 of 35 positions shown; findings below may reference images not displayed]

FINDINGS: CT HEAD FINDINGS

Brain: There is no mass, hemorrhage or extra-axial collection. There
is generalized atrophy without lobar predilection. There is
hypoattenuation of the periventricular white matter, most commonly
indicating chronic ischemic microangiopathy.

Vascular: No abnormal hyperdensity of the major intracranial
arteries or dural venous sinuses. No intracranial atherosclerosis.

Skull: The visualized skull base, calvarium and extracranial soft
tissues are normal.

Sinuses/Orbits: No fluid levels or advanced mucosal thickening of
the visualized paranasal sinuses. No mastoid or middle ear effusion.
The orbits are normal.

CT CERVICAL SPINE FINDINGS

Alignment: No static subluxation. Facets are aligned. Occipital
condyles are normally positioned.

Skull base and vertebrae: No acute fracture.

Soft tissues and spinal canal: No prevertebral fluid or swelling. No
visible canal hematoma.

Disc levels: No advanced spinal canal or neural foraminal stenosis.
Multilevel degenerative disc disease.

Upper chest: No pneumothorax, pulmonary nodule or pleural effusion.

Other: Normal visualized paraspinal cervical soft tissues.
IMPRESSION: 1. Chronic ischemic microangiopathy and generalized atrophy without
acute intracranial abnormality.
2. No acute fracture or static subluxation of the cervical spine.

## 2020-03-16 IMAGING — DX DG PORTABLE PELVIS
1 series · 1 of 1 positions shown · non-contrast
Comparison: None.

CLINICAL DATA: Fall.  Pelvic pain.  Initial encounter.

EXAM:
PORTABLE PELVIS 1-2 VIEWS

[pelvis ap]
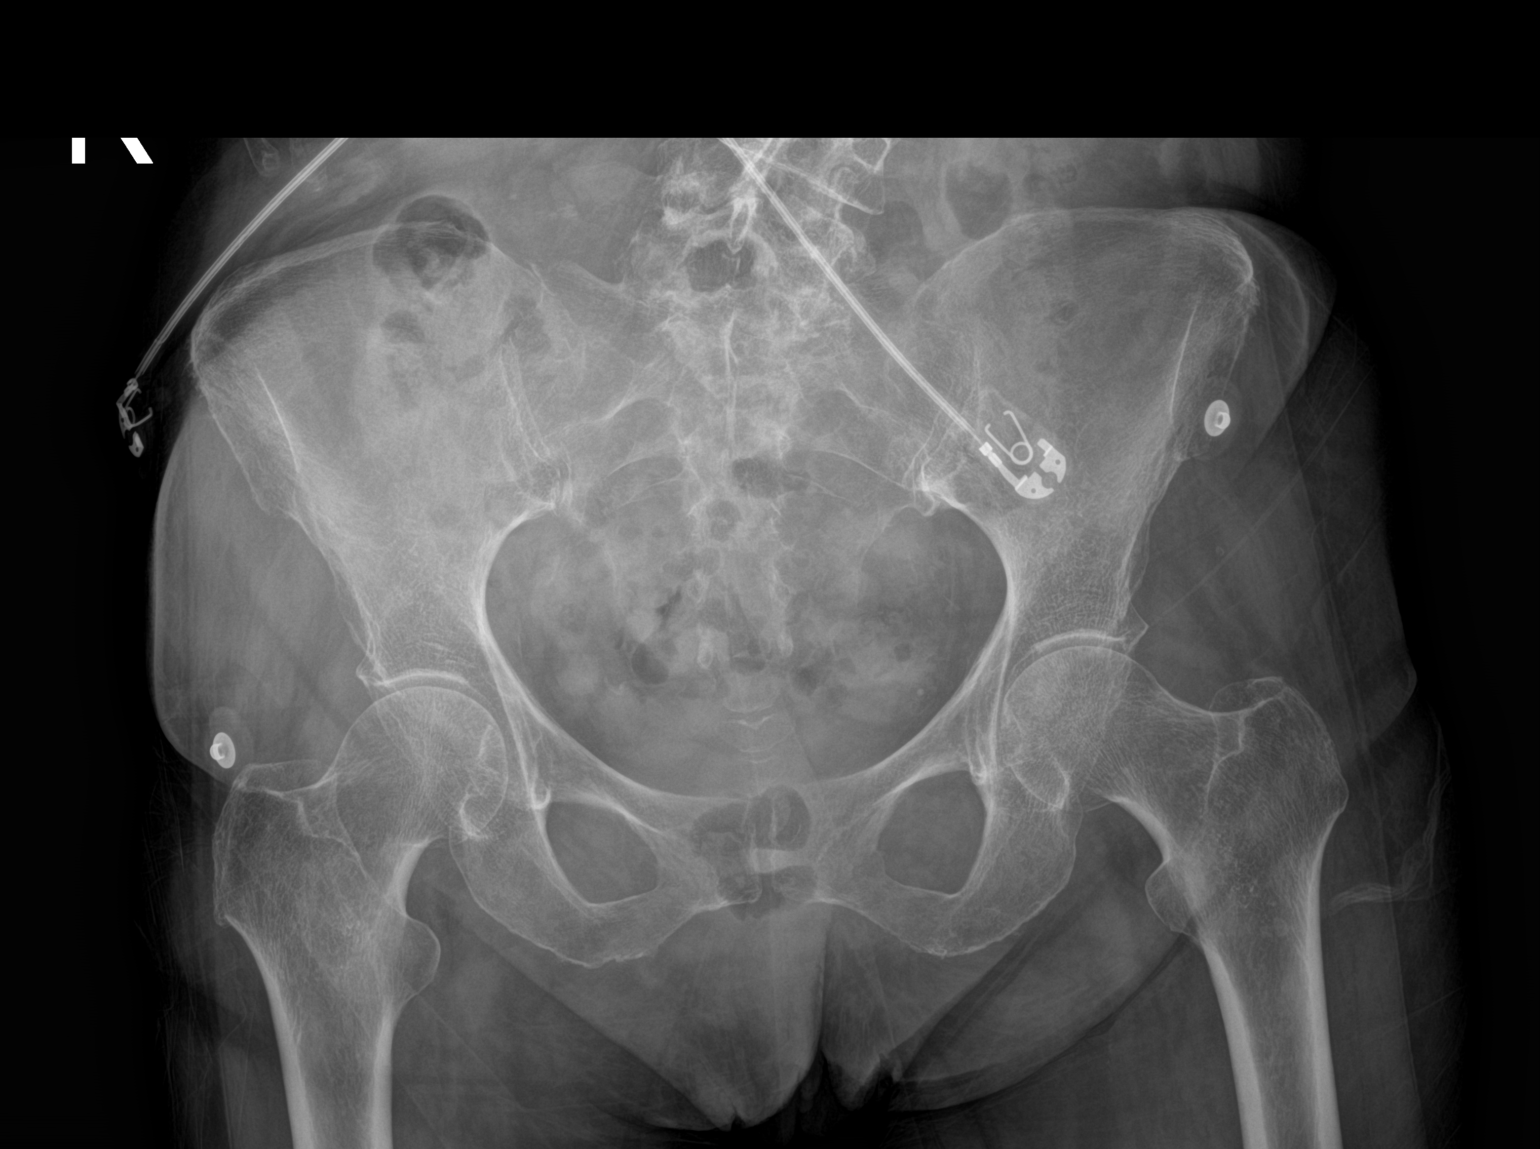

[1 of 1 positions shown; findings below may reference images not displayed]

FINDINGS: There is no evidence of pelvic fracture or diastasis. No pelvic bone
lesions are seen.
IMPRESSION: Negative.

## 2020-03-16 IMAGING — DX DG CHEST 1V PORT
1 series · 1 of 1 positions shown · non-contrast
Comparison: [DATE]

CLINICAL DATA: Altered level of consciousness.  Fall.

EXAM:
PORTABLE CHEST 1 VIEW

[chest ap]
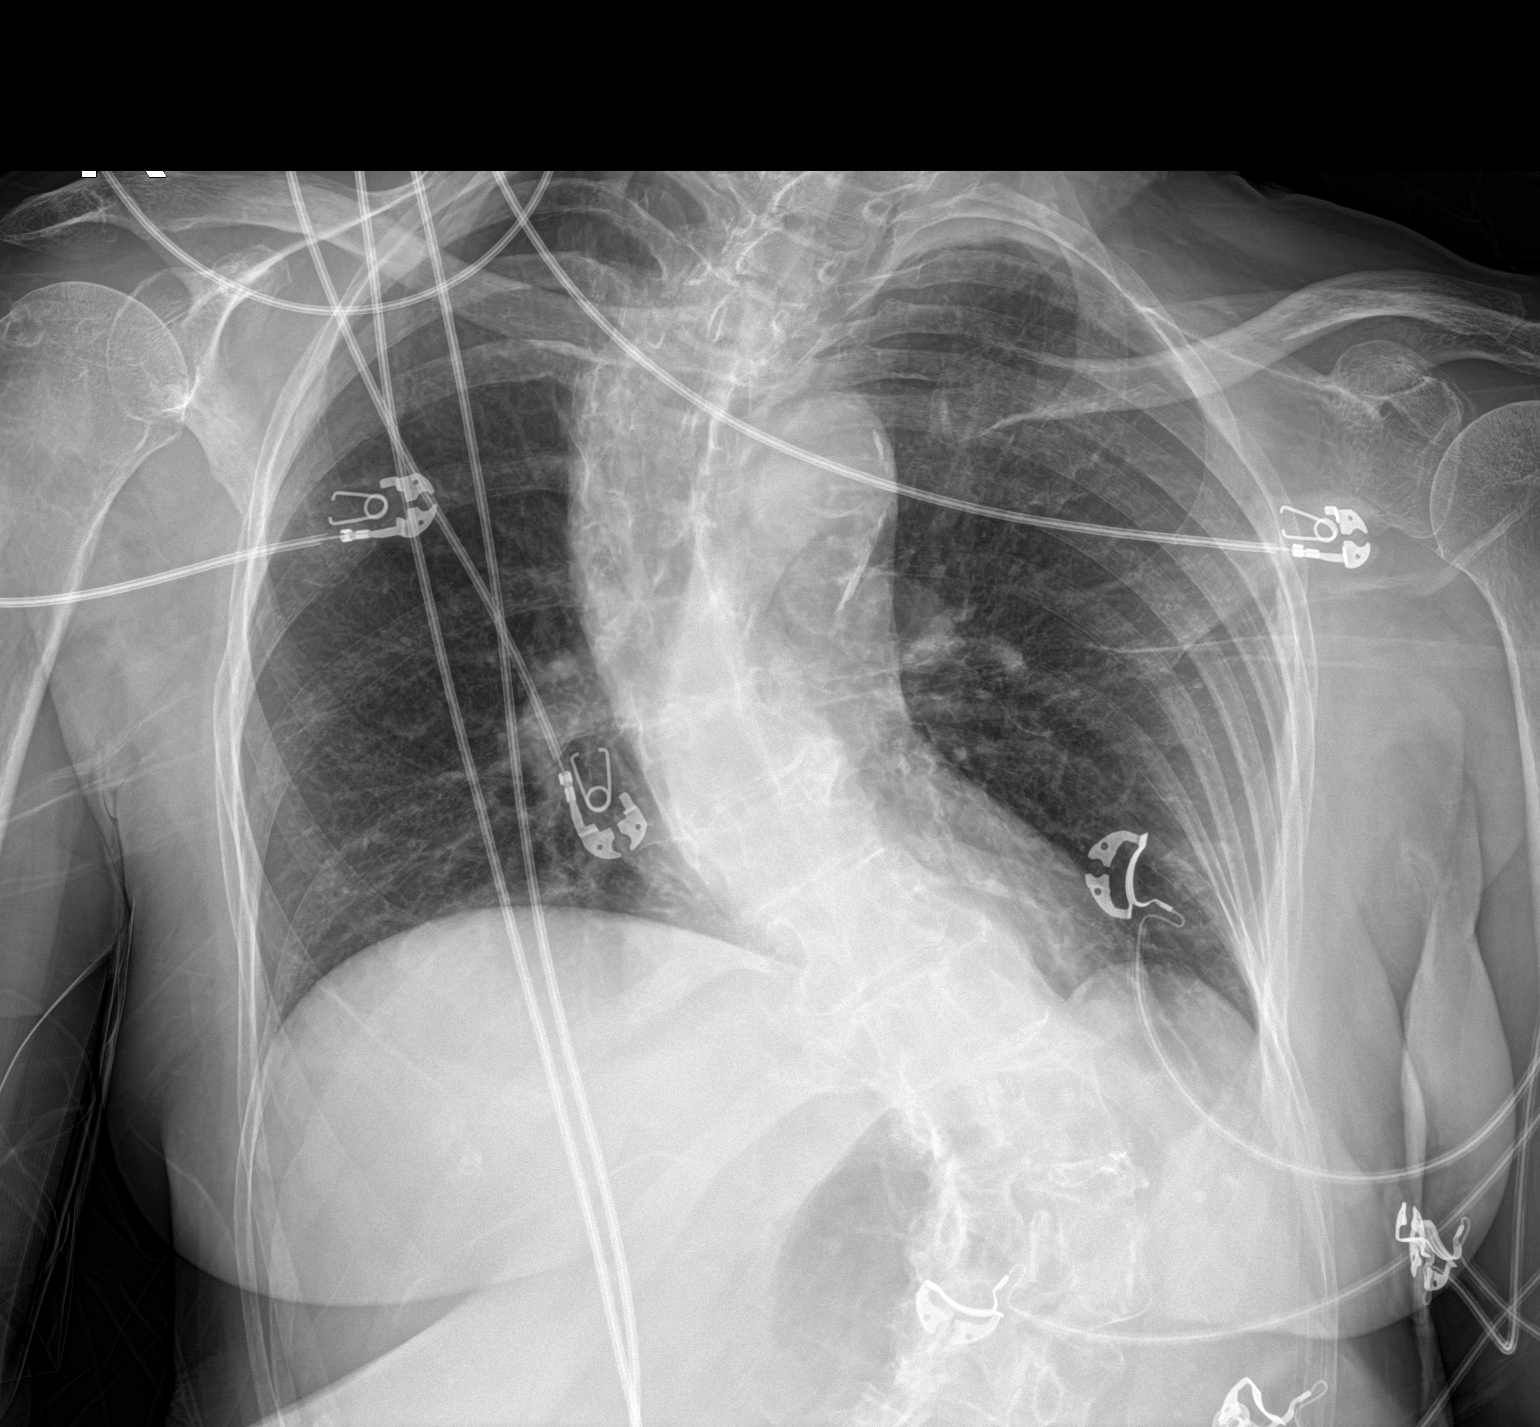

[1 of 1 positions shown; findings below may reference images not displayed]

FINDINGS: The heart size and mediastinal contours are within normal limits.
Aortic atherosclerotic calcification noted. Both lungs are clear. No
evidence of pneumothorax or pleural effusion. Severe S-shaped
thoracolumbar scoliosis is again noted.
IMPRESSION: No active disease.

## 2020-03-16 MED ORDER — ONDANSETRON HCL 4 MG/2ML IJ SOLN: 4.0000 mg | Freq: Four times a day (QID) | INTRAMUSCULAR | Status: DC | PRN

## 2020-03-16 MED ORDER — MIRTAZAPINE 15 MG PO TABS
7.5000 mg | ORAL_TABLET | Freq: Every day | ORAL | Status: DC
  Administered 2020-03-16 – 2020-03-17 (×2): 7.5 mg via ORAL
  Filled 2020-03-16 (×3): qty 1

## 2020-03-16 MED ORDER — LEVOTHYROXINE SODIUM 25 MCG PO TABS
25.0000 ug | ORAL_TABLET | ORAL | Status: DC
  Administered 2020-03-17: 25 ug via ORAL
  Filled 2020-03-16 (×2): qty 1

## 2020-03-16 MED ORDER — ONDANSETRON HCL 4 MG PO TABS: 4.0000 mg | ORAL_TABLET | Freq: Four times a day (QID) | ORAL | Status: DC | PRN

## 2020-03-16 MED ORDER — LEVOTHYROXINE SODIUM 50 MCG PO TABS
50.0000 ug | ORAL_TABLET | ORAL | Status: DC
  Administered 2020-03-18: 50 ug via ORAL

## 2020-03-16 MED ORDER — MAGNESIUM HYDROXIDE 400 MG/5ML PO SUSP
30.0000 mL | Freq: Every day | ORAL | Status: DC | PRN
  Filled 2020-03-16: qty 30

## 2020-03-16 MED ORDER — LABETALOL HCL 5 MG/ML IV SOLN: 20.0000 mg | INTRAVENOUS | Status: DC | PRN

## 2020-03-16 MED ORDER — VITAMIN D 25 MCG (1000 UNIT) PO TABS
1000.0000 [IU] | ORAL_TABLET | Freq: Every day | ORAL | Status: DC
  Administered 2020-03-17 – 2020-03-18 (×2): 1000 [IU] via ORAL
  Filled 2020-03-16 (×2): qty 1

## 2020-03-16 MED ORDER — ADULT MULTIVITAMIN W/MINERALS CH
1.0000 | ORAL_TABLET | Freq: Every day | ORAL | Status: DC
  Administered 2020-03-17 – 2020-03-18 (×2): 1 via ORAL
  Filled 2020-03-16 (×2): qty 1

## 2020-03-16 MED ORDER — ACETAMINOPHEN 325 MG PO TABS
650.0000 mg | ORAL_TABLET | ORAL | Status: DC | PRN
  Administered 2020-03-16: 650 mg via ORAL
  Filled 2020-03-16 (×2): qty 2

## 2020-03-16 MED ORDER — ASPIRIN EC 81 MG PO TBEC
81.0000 mg | DELAYED_RELEASE_TABLET | Freq: Every day | ORAL | Status: DC
  Administered 2020-03-17 – 2020-03-18 (×2): 81 mg via ORAL
  Filled 2020-03-16 (×2): qty 1

## 2020-03-16 MED ORDER — LACTATED RINGERS IV BOLUS
1000.0000 mL | Freq: Once | INTRAVENOUS | Status: AC
  Administered 2020-03-16: 1000 mL via INTRAVENOUS

## 2020-03-16 MED ORDER — DONEPEZIL HCL 10 MG PO TABS
10.0000 mg | ORAL_TABLET | Freq: Every day | ORAL | Status: DC
  Administered 2020-03-16 – 2020-03-17 (×2): 10 mg via ORAL
  Filled 2020-03-16 (×3): qty 1

## 2020-03-16 MED ORDER — LORAZEPAM 2 MG/ML IJ SOLN: 1.0000 mg | INTRAMUSCULAR | Status: DC | PRN

## 2020-03-16 MED ORDER — SODIUM CHLORIDE 0.9 % IV SOLN
75.0000 mL/h | INTRAVENOUS | Status: DC
  Administered 2020-03-17 (×2): 75 mL/h via INTRAVENOUS

## 2020-03-16 MED ORDER — ENOXAPARIN SODIUM 30 MG/0.3ML ~~LOC~~ SOLN
30.0000 mg | SUBCUTANEOUS | Status: DC
  Administered 2020-03-17: 30 mg via SUBCUTANEOUS
  Filled 2020-03-16: qty 0.3

## 2020-03-16 MED ORDER — ACETAMINOPHEN 650 MG RE SUPP: 650.0000 mg | RECTAL | Status: DC | PRN

## 2020-03-16 NOTE — ED Triage Notes (Signed)
Pt arrives via [facility] from [facility]. Was found on ground seizures. No hx of seizures. 5mg  of versed given. Pt arrived being bagged due to shallow respirations.

## 2020-03-16 NOTE — H&P (Addendum)
[place]   PATIENT NAME: Isabel Zamora    MR#:  [id number]  DATE OF BIRTH:  1929/11/20  DATE OF ADMISSION:  03/16/2020  PRIMARY CARE PHYSICIAN: Pcp, No   REQUESTING/REFERRING PHYSICIAN:Katz, Stevphen Meuse, MD  CHIEF COMPLAINT:   Chief Complaint  Patient presents with  . Seizures    HISTORY OF PRESENT ILLNESS:  Isabel Zamora  is a 86 y.o. Caucasian female with a known history of dementia, hypertension, hypothyroidism and dyslipidemia, who presented to the emergency room with acute onset of tonic-clonic seizure [facility].  The patient was apparently found on the floor.  It is unclear if she had head injury with her seizures.  She was slightly confused in between seizures.  It lasted about 5 to 6 minutes.  She denies any paresthesias or focal muscle weakness.  No headache or dizziness or blurred vision.  No chest pain or dyspnea or palpitations.  No dysuria, oliguria or hematuria or flank pain.  She is a fairly poor historian due to her dementia.  Upon presentation to the emergency room, vital signs were within normal.  Labs revealed an ABG with pH 7.126, bicarbonate 15.2 was 8.7.  Urinalysis was negative M chest x-ray showed no acute cardiopulmonary disease.  CMP revealed hypokalemia of 3.2 with a blood glucose of 216 and CO2 14, creatinine 1.29 later 0.9 and magnesium of 2 with albumin of 3.4 and AST 46.  CBC showed no leukocytosis of 12.2 and stable H&H.  COVID-19 PCR and influenza antigens came back negative.  TSH was 3.8.  Pelvic x-ray came back negative.  Noncontrasted head CT scan revealed chronic ischemic microangiopathy and generalized atrophy without acute intracranial normalities.  C-spine CT revealed no acute fracture or subluxation of the C-spine.  The patient was given 1 L bolus of IV lactated Ringer.  She will be admitted to a telemetry medical monitored bed for further evaluation and management.  PAST MEDICAL HISTORY:   Past  Medical History:  Diagnosis Date  . Dementia (HCC)   . HLD (hyperlipidemia)   . HTN (hypertension)   . Hypothyroidism   . Kyphosis   -Post poliomyelitis syndrome  PAST SURGICAL HISTORY:  No past surgical history on file.  No reported previous surgeries.  SOCIAL HISTORY:   Social History   Tobacco Use  . Smoking status: Never Smoker  . Smokeless tobacco: Never Used  Substance Use Topics  . Alcohol use: Never    FAMILY HISTORY:  No family history on file.  No pertinent familial diseases.  DRUG ALLERGIES:   Allergies  Allergen Reactions  . Morphine And Related Other (See Comments)    "Allergic," per MAR  . Penicillins Other (See Comments)    "Allergic," per MAR    REVIEW OF SYSTEMS:   ROS As per history of present illness. All pertinent systems were reviewed above. Constitutional, HEENT, cardiovascular, respiratory, GI, GU, musculoskeletal, neuro, psychiatric, endocrine, integumentary and hematologic systems were reviewed and are otherwise negative/unremarkable except for positive findings mentioned above in the HPI.   MEDICATIONS AT HOME:   Prior to Admission medications   Medication Sig Start Date End Date Taking? Authorizing Provider  acetaminophen (TYLENOL) 500 MG tablet Take 1 tablet (500 mg total) by mouth every 6 (six) hours as needed for mild pain or fever. Patient taking differently: Take 500 mg by mouth every 6 (six) hours as needed for mild pain (or a temperature >100.5 F).  02/02/20  Yes  Hughie Closs, MD  aspirin EC 81 MG tablet Take 81 mg by mouth daily. Swallow whole.   Yes [provider]  cholecalciferol (VITAMIN D3) 25 MCG (1000 UNIT) tablet Take 1,000 Units by mouth daily.   Yes [provider]  donepezil (ARICEPT) 10 MG tablet Take 10 mg by mouth at bedtime. 01/13/20  Yes [provider]  Ensure (ENSURE) Take 237 mLs by mouth daily as needed (for a missed meal).    Yes [provider]  levothyroxine (SYNTHROID) 25  MCG tablet Take 25 mcg by mouth every Monday, Wednesday, and Friday.  01/28/20  Yes [provider]  levothyroxine (SYNTHROID) 50 MCG tablet Take 50 mcg by mouth every Tuesday, Thursday, Saturday, and Sunday.   Yes [provider]  mirtazapine (REMERON) 7.5 MG tablet Take 7.5 mg by mouth at bedtime. 01/13/20  Yes [provider]  Multiple Vitamins-Minerals (ALIVE WOMENS GUMMY) CHEW Chew 1 tablet by mouth daily.   Yes [provider]      VITAL SIGNS:  Blood pressure 130/67, pulse 99, temperature 98 F (36.7 C), temperature source Oral, resp. rate 20, height 5\' 2"  (1.575 m), weight 48.5 kg, SpO2 100 %.  PHYSICAL EXAMINATION:  Physical Exam  GENERAL:  86 y.o.-year-old patient lying in the bed with no acute distress.  She is somnolent but arousable and cooperative. EYES: Pupils equal, round, reactive to light and accommodation. No scleral icterus. Extraocular muscles intact.  HEENT: Head atraumatic, normocephalic. Oropharynx and nasopharynx clear.  NECK:  Supple, no jugular venous distention. No thyroid enlargement, no tenderness.  LUNGS: Normal breath sounds bilaterally, no wheezing, rales,rhonchi or crepitation. No use of accessory muscles of respiration.  CARDIOVASCULAR: Regular rate and rhythm, S1, S2 normal. No murmurs, rubs, or gallops.  ABDOMEN: Soft, nondistended, nontender. Bowel sounds present. No organomegaly or mass.  EXTREMITIES: No pedal edema, cyanosis, or clubbing.  NEUROLOGIC: Cranial nerves II through XII are intact. Muscle strength 5/5 in all extremities. Sensation intact. Gait not checked.  PSYCHIATRIC: The patient is somnolent but arousable and only oriented x 1 to her name.  No good eye contact. SKIN: No obvious rash, lesion, or ulcer.   LABORATORY PANEL:   CBC Recent Labs  Lab 03/16/20 1820 03/16/20 1831 03/16/20 1833  WBC 12.2*  --   --   HGB 12.5   < > 13.9  HCT 40.9   < > 41.0  PLT 355  --   --    < > = values in this  interval not displayed.   ------------------------------------------------------------------------------------------------------------------  Chemistries  Recent Labs  Lab 03/16/20 1820 03/16/20 1820 03/16/20 1831 03/16/20 1831 03/16/20 1833  NA 137   < > 137   < > 137  K 3.2*   < > 3.8   < > 3.8  CL 98   < > 102  --   --   CO2 14*  --   --   --   --   GLUCOSE 216*   < > 208*  --   --   BUN 16   < > 22  --   --   CREATININE 1.21*   < > 0.90  --   --   CALCIUM 9.2  --   --   --   --   MG 2.0  --   --   --   --   AST 46*  --   --   --   --   ALT 26  --   --   --   --  ALKPHOS 90  --   --   --   --   BILITOT 0.6  --   --   --   --    < > = values in this interval not displayed.   ------------------------------------------------------------------------------------------------------------------  Cardiac Enzymes No results for input(s): TROPONINI in the last 168 hours. ------------------------------------------------------------------------------------------------------------------  RADIOLOGY:  CT Head Wo Contrast  Result Date: 03/16/2020 CLINICAL DATA:  Seizure and fall EXAM: CT HEAD WITHOUT CONTRAST CT CERVICAL SPINE WITHOUT CONTRAST TECHNIQUE: Multidetector CT imaging of the head and cervical spine was performed following the standard protocol without intravenous contrast. Multiplanar CT image reconstructions of the cervical spine were also generated. COMPARISON:  None. FINDINGS: CT HEAD FINDINGS Brain: There is no mass, hemorrhage or extra-axial collection. There is generalized atrophy without lobar predilection. There is hypoattenuation of the periventricular white matter, most commonly indicating chronic ischemic microangiopathy. Vascular: No abnormal hyperdensity of the major intracranial arteries or dural venous sinuses. No intracranial atherosclerosis. Skull: The visualized skull base, calvarium and extracranial soft tissues are normal. Sinuses/Orbits: No fluid levels or  advanced mucosal thickening of the visualized paranasal sinuses. No mastoid or middle ear effusion. The orbits are normal. CT CERVICAL SPINE FINDINGS Alignment: No static subluxation. Facets are aligned. Occipital condyles are normally positioned. Skull base and vertebrae: No acute fracture. Soft tissues and spinal canal: No prevertebral fluid or swelling. No visible canal hematoma. Disc levels: No advanced spinal canal or neural foraminal stenosis. Multilevel degenerative disc disease. Upper chest: No pneumothorax, pulmonary nodule or pleural effusion. Other: Normal visualized paraspinal cervical soft tissues. IMPRESSION: 1. Chronic ischemic microangiopathy and generalized atrophy without acute intracranial abnormality. 2. No acute fracture or static subluxation of the cervical spine. Electronically Signed   By: Deatra Robinson M.D.   On: 03/16/2020 20:20   CT Cervical Spine Wo Contrast  Result Date: 03/16/2020 CLINICAL DATA:  Seizure and fall EXAM: CT HEAD WITHOUT CONTRAST CT CERVICAL SPINE WITHOUT CONTRAST TECHNIQUE: Multidetector CT imaging of the head and cervical spine was performed following the standard protocol without intravenous contrast. Multiplanar CT image reconstructions of the cervical spine were also generated. COMPARISON:  None. FINDINGS: CT HEAD FINDINGS Brain: There is no mass, hemorrhage or extra-axial collection. There is generalized atrophy without lobar predilection. There is hypoattenuation of the periventricular white matter, most commonly indicating chronic ischemic microangiopathy. Vascular: No abnormal hyperdensity of the major intracranial arteries or dural venous sinuses. No intracranial atherosclerosis. Skull: The visualized skull base, calvarium and extracranial soft tissues are normal. Sinuses/Orbits: No fluid levels or advanced mucosal thickening of the visualized paranasal sinuses. No mastoid or middle ear effusion. The orbits are normal. CT CERVICAL SPINE FINDINGS Alignment:  No static subluxation. Facets are aligned. Occipital condyles are normally positioned. Skull base and vertebrae: No acute fracture. Soft tissues and spinal canal: No prevertebral fluid or swelling. No visible canal hematoma. Disc levels: No advanced spinal canal or neural foraminal stenosis. Multilevel degenerative disc disease. Upper chest: No pneumothorax, pulmonary nodule or pleural effusion. Other: Normal visualized paraspinal cervical soft tissues. IMPRESSION: 1. Chronic ischemic microangiopathy and generalized atrophy without acute intracranial abnormality. 2. No acute fracture or static subluxation of the cervical spine. Electronically Signed   By: Deatra Robinson M.D.   On: 03/16/2020 20:20   DG Pelvis Portable  Result Date: 03/16/2020 CLINICAL DATA:  Fall.  Pelvic pain.  Initial encounter. EXAM: PORTABLE PELVIS 1-2 VIEWS COMPARISON:  None. FINDINGS: There is no evidence of pelvic fracture or diastasis. No pelvic bone lesions are seen.  IMPRESSION: Negative. Electronically Signed   By: Danae OrleansJohn A Stahl M.D.   On: 03/16/2020 19:10   DG Chest Portable 1 View  Result Date: 03/16/2020 CLINICAL DATA:  Altered level of consciousness.  Fall. EXAM: PORTABLE CHEST 1 VIEW COMPARISON:  02/03/2020 FINDINGS: The heart size and mediastinal contours are within normal limits. Aortic atherosclerotic calcification noted. Both lungs are clear. No evidence of pneumothorax or pleural effusion. Severe S-shaped thoracolumbar scoliosis is again noted. IMPRESSION: No active disease. Electronically Signed   By: Danae OrleansJohn A Stahl M.D.   On: 03/16/2020 19:09      IMPRESSION AND PLAN:   1.  New onset seizures. -The patient will be admitted to a telemetry medical monitored bed. -She will be placed on seizures precautions. -We will place on as needed IV Ativan. -We will follow neuro checks every 4 hours with 24 hours. -Neurology consult will be obtained. -Dr. Iver NestleBhagat is aware about the patient. -We will obtain an EEG.  2.   Elevated lactic acid with lactic acidosis. -This could certainly be related to her seizures. -We will follow lactic acid level. -She has no clear infectious etiology at this time. -She will be placed on IV bicarbonate drip. -BMPs will be followed.  3.  Mild acute kidney injury. -The patient will be hydrated with IV normal saline and will follow her BMP.  4.  Hypothyroidism. -We will continue Synthroid and check TSH.  5.  Dementia. -We will continue Aricept.  6.  Essential hypertension. -She will be placed on as needed IV labetalol.  7.  DVT prophylaxis. -Subtenons Lovenox.   All the records are reviewed and case discussed with ED provider. The plan of care was discussed in details with the patient (and daughter who was with her in the room). I answered all questions. The patient agreed to proceed with the above mentioned plan. Further management will depend upon hospital course.   CODE STATUS: The patient is DNR/DNI.  She has an out of facility DNR form.  Status is: Inpatient  Remains inpatient appropriate because:Altered mental status, Ongoing diagnostic testing needed not appropriate for outpatient work up, Unsafe d/c plan, IV treatments appropriate due to intensity of illness or inability to take PO and Inpatient level of care appropriate due to severity of illness   Dispo: The patient is from: ALF              Anticipated d/c is to: ALF              Anticipated d/c date is: 2 days              Patient currently is not medically stable to d/c.    TOTAL TIME TAKING CARE OF THIS PATIENT: 55 minutes.    Hannah BeatJan A Zaide Kardell M.D on 03/16/2020 at 11:06 PM  [facility]   From 7 PM-7 AM, contact night-coverage [email or web address]  CC: Primary care physician; Pcp, No

## 2020-03-16 NOTE — ED Notes (Signed)
Date and time results received: 03/16/20 1949 (use smartphrase ".now" to insert current time)  Test: Lactic Acid Critical Value: > 11.0  Name of Provider Notified: Myrtis Ser  Orders Received? Or Actions Taken?:

## 2020-03-16 NOTE — ED Provider Notes (Addendum)
Patient is [facility]MENT Provider Note   CSN: [id number] Arrival date & time: 03/16/20  1803     History Chief Complaint  Patient presents with  . Seizures    [facility] is a 86 y.o. female.   Seizures Seizure activity on arrival: no   Seizure type:  Grand mal Initial focality:  Unable to specify Episode characteristics: generalized shaking   Postictal symptoms: somnolence   Return to baseline: no   Severity:  Severe Duration:  15 minutes Timing:  Once Progression:  Partially resolved Context comment:  Unclear cause      Past Medical History:  Diagnosis Date  . Dementia (HCC)   . HLD (hyperlipidemia)   . HTN (hypertension)   . Hypothyroidism   . Kyphosis     Patient Active Problem List   Diagnosis Date Noted  . Seizure (HCC) 03/16/2020  . Fall at home, initial encounter 02/03/2020  . STEMI (ST elevation myocardial infarction) (HCC) 02/03/2020  . Dementia without behavioral disturbance (HCC) 02/03/2020  . Hypothyroidism (acquired) 02/03/2020  . Elevated troponin   . Palliative care by specialist   . Goals of care, counseling/discussion   . UTI (urinary tract infection) 01/30/2020  . AKI (acute kidney injury) (HCC) 01/30/2020  . HTN (hypertension) 01/30/2020  . HLD (hyperlipidemia) 01/30/2020  . Acute metabolic encephalopathy 01/30/2020    No past surgical history on file.   OB History   No obstetric history on file.     No family history on file.  Social History   Tobacco Use  . Smoking status: Never Smoker  . Smokeless tobacco: Never Used  Substance Use Topics  . Alcohol use: Never  . Drug use: Never    Home Medications Prior to Admission medications   Medication Sig Start Date End Date Taking? Authorizing Provider  acetaminophen (TYLENOL) 500 MG tablet Take 1 tablet (500 mg total) by mouth every 6 (six) hours as needed for mild pain or fever. Patient taking differently: Take 500 mg by mouth every  6 (six) hours as needed for mild pain (or a temperature >100.5 F).  02/02/20  Yes Pahwani, Daleen Boavi, MD  aspirin EC 81 MG tablet Take 81 mg by mouth daily. Swallow whole.   Yes [provider]  cholecalciferol (VITAMIN D3) 25 MCG (1000 UNIT) tablet Take 1,000 Units by mouth daily.   Yes [provider]  donepezil (ARICEPT) 10 MG tablet Take 10 mg by mouth at bedtime. 01/13/20  Yes [provider]  Ensure (ENSURE) Take 237 mLs by mouth daily as needed (for a missed meal).    Yes [provider]  levothyroxine (SYNTHROID) 25 MCG tablet Take 25 mcg by mouth every Monday, Wednesday, and Friday.  01/28/20  Yes [provider]  levothyroxine (SYNTHROID) 50 MCG tablet Take 50 mcg by mouth every Tuesday, Thursday, Saturday, and Sunday.   Yes [provider]  mirtazapine (REMERON) 7.5 MG tablet Take 7.5 mg by mouth at bedtime. 01/13/20  Yes [provider]  Multiple Vitamins-Minerals (ALIVE WOMENS GUMMY) CHEW Chew 1 tablet by mouth daily.   Yes [provider]    Allergies    Morphine and related and Penicillins  Review of Systems   Review of Systems  Unable to perform ROS: Acuity of condition  Neurological: Positive for seizures.    Physical Exam Updated Vital Signs BP 130/67   Pulse 99   Temp 98 F (36.7 C) (Oral)   Resp 20   SpO2 100%  Physical Exam Vitals and nursing note reviewed. Exam conducted with a chaperone present.  Constitutional:      General: [facility] is in acute distress.     Appearance: [facility] is toxic-appearing.  HENT:     Head: Normocephalic.     Comments: Small abrasion over the occiput, hemostatic mild hematoma    Nose: No rhinorrhea.  Eyes:     General:        Right eye: No discharge.        Left eye: No discharge.     Conjunctiva/sclera: Conjunctivae normal.     Pupils: Pupils are equal, round, and reactive to light.     Comments: Pupils 2 mm minimally reactive to light equal no disconjugate gaze    Neck:     Comments: In c cpllar  Cardiovascular:     Rate and Rhythm: Regular rhythm. Tachycardia present.     Heart sounds: No murmur heard.   Pulmonary:     Effort: Pulmonary effort is normal. No respiratory distress.     Breath sounds: No stridor. No wheezing or rhonchi.  Abdominal:     General: Abdomen is flat. There is no distension.     Palpations: Abdomen is soft.     Tenderness: There is no abdominal tenderness.  Skin:    General: Skin is warm and dry.  Neurological:     GCS: GCS eye subscore is 1. GCS verbal subscore is 2. GCS motor subscore is 6.     Comments: Patient grimaces to jaw thrust, follows command of opening mouth, does not open eyes.  Withdraws to pain in the lower and upper extremities, localizes to pain as well by trying to swat away my hand from jaw thrust, facial symmetry present, unable to further assess neurologic exam  Psychiatric:        Mood and Affect: Mood normal.        Behavior: Behavior normal.     ED Results / Procedures / Treatments   Labs (all labs ordered are listed, but only abnormal results are displayed) Labs Reviewed  CBC WITH DIFFERENTIAL/PLATELET - Abnormal; Notable for the following components:      Result Value   WBC 12.2 (*)    Neutro Abs 7.9 (*)    Abs Immature Granulocytes 0.21 (*)    All other components within normal limits  COMPREHENSIVE METABOLIC PANEL - Abnormal; Notable for the following components:   Potassium 3.2 (*)    CO2 14 (*)    Glucose, Bld 216 (*)    Creatinine, Ser 1.21 (*)    Albumin 3.4 (*)    AST 46 (*)    GFR, Estimated 43 (*)    Anion gap 25 (*)    All other components within normal limits  LACTIC ACID, PLASMA - Abnormal; Notable for the following components:   Lactic Acid, Venous >11.0 (*)    All other components within normal limits  LACTIC ACID, PLASMA - Abnormal; Notable for the following components:   Lactic Acid, Venous 8.7 (*)    All other components within normal limits  RAPID URINE  DRUG SCREEN, HOSP PERFORMED - Abnormal; Notable for the following components:   Benzodiazepines POSITIVE (*)    All other components within normal limits  URINALYSIS, ROUTINE W REFLEX MICROSCOPIC - Abnormal; Notable for the following components:   APPearance HAZY (*)    Hgb urine dipstick SMALL (*)    Protein, ur 100 (*)    All other components within normal limits  SALICYLATE  LEVEL - Abnormal; Notable for the following components:   Salicylate Lvl <7.0 (*)    All other components within normal limits  ACETAMINOPHEN LEVEL - Abnormal; Notable for the following components:   Acetaminophen (Tylenol), Serum <10 (*)    All other components within normal limits  I-STAT CHEM 8, ED - Abnormal; Notable for the following components:   Glucose, Bld 208 (*)    TCO2 16 (*)    All other components within normal limits  I-STAT VENOUS BLOOD GAS, ED - Abnormal; Notable for the following components:   pH, Ven 7.126 (*)    pO2, Ven 63.0 (*)    Bicarbonate 15.2 (*)    TCO2 17 (*)    Acid-base deficit 14.0 (*)    All other components within normal limits  RESP PANEL BY RT-PCR (FLU A&B, COVID) ARPGX2  URINE CULTURE  MAGNESIUM  TSH  PHOSPHORUS  CBC  COMPREHENSIVE METABOLIC PANEL    EKG EKG Interpretation  Date/Time:  Tuesday March 16 2020 18:08:47 EST Ventricular Rate:  131 PR Interval:    QRS Duration: 115 QT Interval:  439 QTC Calculation: 649 R Axis:   -71 Text Interpretation: Sinus tachycardia Probable left atrial enlargement Left anterior fascicular block Left ventricular hypertrophy Anterior infarct, old Confirmed by Cherlynn Perches ([id number]) on 03/16/2020 6:35:02 PM   Radiology CT Head Wo Contrast  Result Date: 03/16/2020 CLINICAL DATA:  Seizure and fall EXAM: CT HEAD WITHOUT CONTRAST CT CERVICAL SPINE WITHOUT CONTRAST TECHNIQUE: Multidetector CT imaging of the head and cervical spine was performed following the standard protocol without intravenous contrast. Multiplanar CT image  reconstructions of the cervical spine were also generated. COMPARISON:  None. FINDINGS: CT HEAD FINDINGS Brain: There is no mass, hemorrhage or extra-axial collection. There is generalized atrophy without lobar predilection. There is hypoattenuation of the periventricular white matter, most commonly indicating chronic ischemic microangiopathy. Vascular: No abnormal hyperdensity of the major intracranial arteries or dural venous sinuses. No intracranial atherosclerosis. Skull: The visualized skull base, calvarium and extracranial soft tissues are normal. Sinuses/Orbits: No fluid levels or advanced mucosal thickening of the visualized paranasal sinuses. No mastoid or middle ear effusion. The orbits are normal. CT CERVICAL SPINE FINDINGS Alignment: No static subluxation. Facets are aligned. Occipital condyles are normally positioned. Skull base and vertebrae: No acute fracture. Soft tissues and spinal canal: No prevertebral fluid or swelling. No visible canal hematoma. Disc levels: No advanced spinal canal or neural foraminal stenosis. Multilevel degenerative disc disease. Upper chest: No pneumothorax, pulmonary nodule or pleural effusion. Other: Normal visualized paraspinal cervical soft tissues. IMPRESSION: 1. Chronic ischemic microangiopathy and generalized atrophy without acute intracranial abnormality. 2. No acute fracture or static subluxation of the cervical spine. Electronically Signed   By: Deatra Robinson M.D.   On: 03/16/2020 20:20   CT Cervical Spine Wo Contrast  Result Date: 03/16/2020 CLINICAL DATA:  Seizure and fall EXAM: CT HEAD WITHOUT CONTRAST CT CERVICAL SPINE WITHOUT CONTRAST TECHNIQUE: Multidetector CT imaging of the head and cervical spine was performed following the standard protocol without intravenous contrast. Multiplanar CT image reconstructions of the cervical spine were also generated. COMPARISON:  None. FINDINGS: CT HEAD FINDINGS Brain: There is no mass, hemorrhage or extra-axial  collection. There is generalized atrophy without lobar predilection. There is hypoattenuation of the periventricular white matter, most commonly indicating chronic ischemic microangiopathy. Vascular: No abnormal hyperdensity of the major intracranial arteries or dural venous sinuses. No intracranial atherosclerosis. Skull: The visualized skull base, calvarium and extracranial soft tissues are normal. Sinuses/Orbits:  No fluid levels or advanced mucosal thickening of the visualized paranasal sinuses. No mastoid or middle ear effusion. The orbits are normal. CT CERVICAL SPINE FINDINGS Alignment: No static subluxation. Facets are aligned. Occipital condyles are normally positioned. Skull base and vertebrae: No acute fracture. Soft tissues and spinal canal: No prevertebral fluid or swelling. No visible canal hematoma. Disc levels: No advanced spinal canal or neural foraminal stenosis. Multilevel degenerative disc disease. Upper chest: No pneumothorax, pulmonary nodule or pleural effusion. Other: Normal visualized paraspinal cervical soft tissues. IMPRESSION: 1. Chronic ischemic microangiopathy and generalized atrophy without acute intracranial abnormality. 2. No acute fracture or static subluxation of the cervical spine. Electronically Signed   By: Deatra Robinson M.D.   On: 03/16/2020 20:20   DG Pelvis Portable  Result Date: 03/16/2020 CLINICAL DATA:  Fall.  Pelvic pain.  Initial encounter. EXAM: PORTABLE PELVIS 1-2 VIEWS COMPARISON:  None. FINDINGS: There is no evidence of pelvic fracture or diastasis. No pelvic bone lesions are seen. IMPRESSION: Negative. Electronically Signed   By: Danae Orleans M.D.   On: 03/16/2020 19:10   DG Chest Portable 1 View  Result Date: 03/16/2020 CLINICAL DATA:  Altered level of consciousness.  Fall. EXAM: PORTABLE CHEST 1 VIEW COMPARISON:  02/03/2020 FINDINGS: The heart size and mediastinal contours are within normal limits. Aortic atherosclerotic calcification noted. Both lungs  are clear. No evidence of pneumothorax or pleural effusion. Severe S-shaped thoracolumbar scoliosis is again noted. IMPRESSION: No active disease. Electronically Signed   By: Danae Orleans M.D.   On: 03/16/2020 19:09    Procedures .Critical Care Performed by: Sabino Donovan, MD Authorized by: Sabino Donovan, MD   Critical care provider statement:    Critical care time (minutes):  45   Critical care was necessary to treat or prevent imminent or life-threatening deterioration of the following conditions:  CNS failure or compromise   Critical care was time spent personally by me on the following activities:  Discussions with consultants, evaluation of patient's response to treatment, examination of patient, ordering and performing treatments and interventions, ordering and review of laboratory studies, ordering and review of radiographic studies, pulse oximetry, re-evaluation of patient's condition, obtaining history from patient or surrogate, review of old charts, blood draw for specimens and development of treatment plan with patient or surrogate   (including critical care time)  Medications Ordered in ED Medications  aspirin EC tablet 81 mg (has no administration in time range)  donepezil (ARICEPT) tablet 10 mg (has no administration in time range)  mirtazapine (REMERON) tablet 7.5 mg (has no administration in time range)  levothyroxine (SYNTHROID) tablet 25 mcg (has no administration in time range)  levothyroxine (SYNTHROID) tablet 50 mcg (has no administration in time range)  cholecalciferol (VITAMIN D3) tablet 1,000 Units (has no administration in time range)  Alive Womens Gummy CHEW 1 tablet (has no administration in time range)  0.9 %  sodium chloride infusion (has no administration in time range)  enoxaparin (LOVENOX) injection 40 mg (has no administration in time range)  LORazepam (ATIVAN) injection 1-2 mg (has no administration in time range)  acetaminophen (TYLENOL) tablet 650 mg (has  no administration in time range)    Or  acetaminophen (TYLENOL) suppository 650 mg (has no administration in time range)  magnesium hydroxide (MILK OF MAGNESIA) suspension 30 mL (has no administration in time range)  ondansetron (ZOFRAN) tablet 4 mg (has no administration in time range)    Or  ondansetron (ZOFRAN) injection 4 mg (has no administration in  time range)  lactated ringers bolus 1,000 mL (1,000 mLs Intravenous New Bag/Given 03/16/20 1830)    ED Course  I have reviewed the triage vital signs and the nursing notes.  Pertinent labs & imaging results that were available during my care of the patient were reviewed by me and considered in my medical decision making (see chart for details).    MDM Rules/Calculators/A&P                          Patient with a history of dementia living at skilled nursing facility comes today after tonic-clonic seizure.  15 minutes of seizure-like activity.  5 mg of IV Versed given in route.  This aborted seizure activity.  Somnolent postictal.  Bag mask ventilation was being provided by EMS, upon arrival patient has spontaneous respirations we transition to nasal cannula.  O2 sat stable.  Tachycardia.  EKG reviewed by me shows sinus tachycardia without acute ischemic change interval abnormality or arrhythmia.  Glucose by EMS was 200.  No overt signs of trauma will get CT imaging of the head and neck patient still in c-collar.  No signs of traumatic injury to the extremities torso or trunk but will get chest x-ray and pelvis x-ray.  We will get screening laboratory analyses will get fluids.  Patient recently was admitted to [facility] for metabolic encephalopathy likely secondary to UTI, at that time had negative MRI, had negative work-up by neurology.  Patient has a DNR form here, further details needed from family regarding that.  Initial lactic acid is markedly elevated, likely secondary to seizure-like activity.  IV fluids were given and I suspect this  will resolve.  AKI, IV fluids will help this as well.  No significant derangements otherwise.  Her mental status is returning to baseline with chronic confusion.  Able to follow some commands.  History of dementia.  Family is at bedside and understand the plan.  Urinalysis still pending, CT imaging still pending review by radiology.  Patient's vital signs continue to improve.  [facility]abel [facility] continues to rest comfortably.  I spoke with neurologist about this new onset seizure, they do not recommend any antiepileptics at this time.  The patient will need further work-up.  They asked [place] to get a phosphorus and to repeat vitals and repeat laboratory studies to make sure lactic acidosis and leukocytosis is downtrending.  They recommended medicine admission with consultation by them.  CT imaging is back with no acute fracture or malalignment or intracranial process.  C-collar was taken off at bedside.  The patient will be admitted to the hospitalist.  For the remainder this patient's care please see inpatient team notes.  I will intervene as needed while the patient remains in the emergency department.    CRITICAL CARE Performed by: Sabino Donovan   Total critical care time: 60 minutes  Critical care time was exclusive of separately billable procedures and treating other patients.  Critical care was necessary to treat or prevent imminent or life-threatening deterioration.  Critical care was time spent personally by me on the following activities: development of treatment plan with patient and/or surrogate as well as nursing, discussions with consultants, evaluation of patient's response to treatment, examination of patient, obtaining history from patient or surrogate, ordering and performing treatments and interventions, ordering and review of laboratory studies, ordering and review of radiographic studies, pulse oximetry and re-evaluation of patient's condition.  Final Clinical Impression(s) / ED  Diagnoses Final diagnoses:  Seizure (HCC)  Lactic acid acidosis  AKI (acute kidney injury) [facility])    Rx / DC Orders ED Discharge Orders    None       Sabino Donovan, MD 03/16/20 2203    Sabino Donovan, MD 03/16/20 2241

## 2020-03-16 NOTE — ED Notes (Signed)
MD aware of pH

## 2020-03-17 ENCOUNTER — Encounter (HOSPITAL_COMMUNITY): Payer: Self-pay | Admitting: Family Medicine

## 2020-03-17 ENCOUNTER — Inpatient Hospital Stay (HOSPITAL_COMMUNITY): Payer: Medicare Other

## 2020-03-17 ENCOUNTER — Other Ambulatory Visit: Payer: Self-pay

## 2020-03-17 DIAGNOSIS — R569 Unspecified convulsions: Secondary | ICD-10-CM

## 2020-03-17 DIAGNOSIS — N179 Acute kidney failure, unspecified: Secondary | ICD-10-CM | POA: Diagnosis not present

## 2020-03-17 LAB — COMPREHENSIVE METABOLIC PANEL
ALT: 23 U/L (ref 0–44)
AST: 37 U/L (ref 15–41)
Albumin: 3.1 g/dL — ABNORMAL LOW (ref 3.5–5.0)
Alkaline Phosphatase: 72 U/L (ref 38–126)
Anion gap: 14 (ref 5–15)
BUN: 13 mg/dL (ref 8–23)
CO2: 21 mmol/L — ABNORMAL LOW (ref 22–32)
Calcium: 9 mg/dL (ref 8.9–10.3)
Chloride: 101 mmol/L (ref 98–111)
Creatinine, Ser: 0.72 mg/dL (ref 0.44–1.00)
GFR, Estimated: >60 mL/min (ref >60)
Glucose, Bld: 90 mg/dL (ref 70–99)
Potassium: 3.4 mmol/L — ABNORMAL LOW (ref 3.5–5.1)
Sodium: 136 mmol/L (ref 135–145)
Total Bilirubin: 0.5 mg/dL (ref 0.3–1.2)
Total Protein: 6 g/dL — ABNORMAL LOW (ref 6.5–8.1)

## 2020-03-17 LAB — CBC
HCT: 34.4 % — ABNORMAL LOW (ref 36.0–46.0)
Hemoglobin: 10.9 g/dL — ABNORMAL LOW (ref 12.0–15.0)
MCH: 29.9 pg (ref 26.0–34.0)
MCHC: 31.7 g/dL (ref 30.0–36.0)
MCV: 94.2 fL (ref 80.0–100.0)
Platelets: 307 10*3/uL (ref 150–400)
RBC: 3.65 MIL/uL — ABNORMAL LOW (ref 3.87–5.11)
RDW: 14.1 % (ref 11.5–15.5)
WBC: 12.1 10*3/uL — ABNORMAL HIGH (ref 4.0–10.5)
nRBC: 0 % (ref 0.0–0.2)

## 2020-03-17 LAB — LACTIC ACID, PLASMA
Lactic Acid, Venous: 1.3 mmol/L (ref 0.5–1.9)
Lactic Acid, Venous: 1.2 mmol/L (ref 0.5–1.9)
Lactic Acid, Venous: 1.3 mmol/L (ref 0.5–1.9)

## 2020-03-17 LAB — TSH: TSH: 0.844 u[IU]/mL (ref 0.350–4.500)

## 2020-03-17 LAB — PHOSPHORUS: Phosphorus: 2.8 mg/dL (ref 2.5–4.6)

## 2020-03-17 LAB — CK: Total CK: 369 U/L — ABNORMAL HIGH (ref 38–234)

## 2020-03-17 IMAGING — MR MR HEAD W/O CM
14 of 15 series · 44 of 48 positions shown · non-contrast
Comparison: Brain MRI [DATE]

CLINICAL DATA: Seizure

EXAM:
MRI HEAD WITHOUT CONTRAST
TECHNIQUE: Multiplanar, multiecho pulse sequences of the brain and surrounding
structures were obtained without intravenous contrast.

[Series 5: DWI · axial · 3.0mm · 0.88mm/px · z∈[-125,+32]mm · 7 of 108 slices shown (1 of 4)]
[im 1/108]
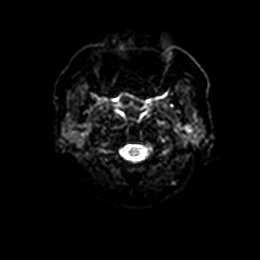
[im 18/108]
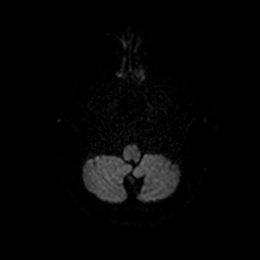
[im 36/108]
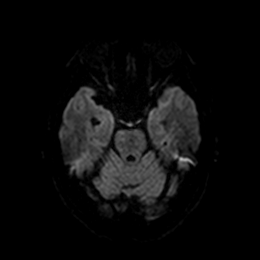
[im 54/108]
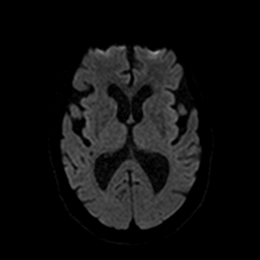
[im 72/108]
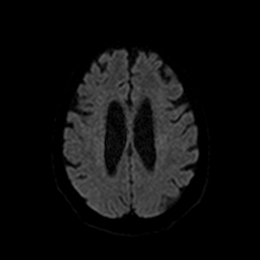
[im 90/108]
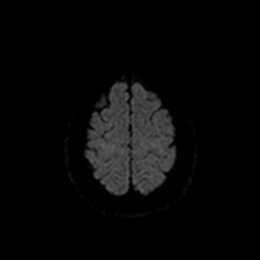
[im 108/108]
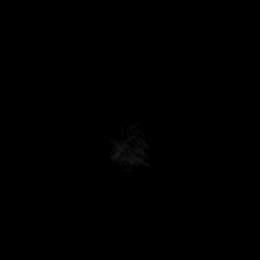

[Series 6: DWI · axial · 3.0mm · 0.88mm/px · z∈[-125,+32]mm · 4 of 54 slices shown (2 of 4)]
[im 1/54]
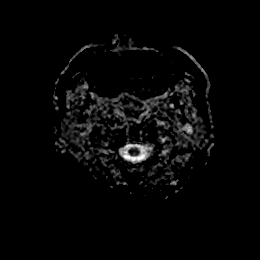
[im 18/54]
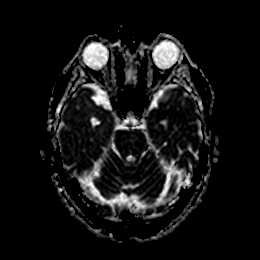
[im 36/54]
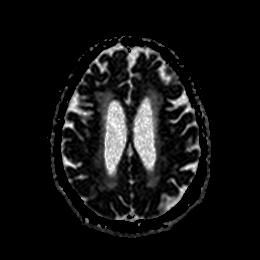
[im 54/54]
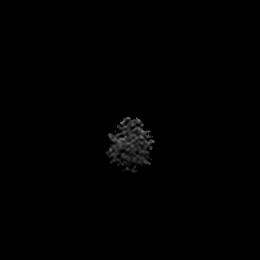

[Series 7: DWI · coronal · 4.0mm · 0.88mm/px · 5 of 76 slices shown (3 of 4)]
[im 1/76]
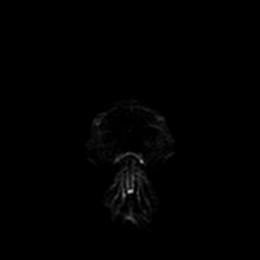
[im 19/76]
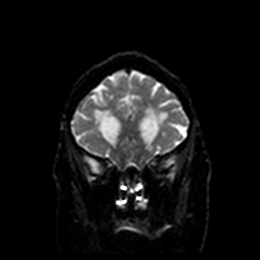
[im 38/76]
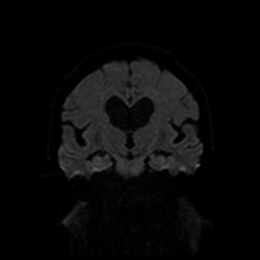
[im 57/76]
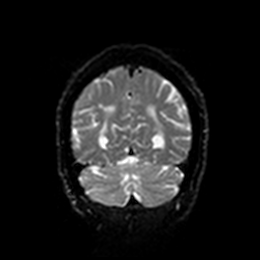
[im 76/76]
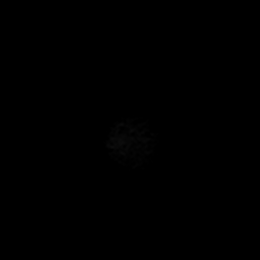

[Series 8: DWI · coronal · 4.0mm · 0.88mm/px · 3 of 38 slices shown (4 of 4)]
[im 1/38]
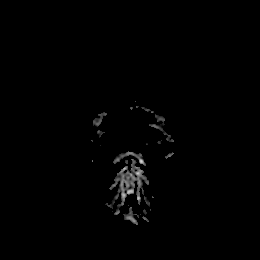
[im 19/38]
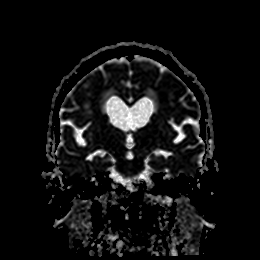
[im 38/38]
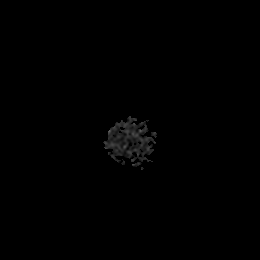

[Series 9: T1 · sagittal · 5.0mm · 0.75mm/px · 2 of 23 slices shown]
[im 1/23]
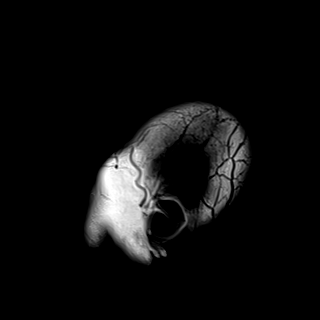
[im 23/23]
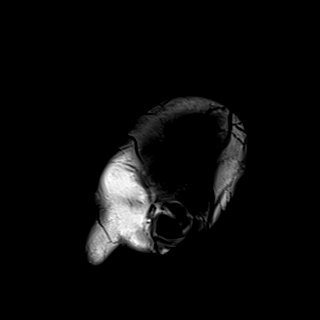

[Series 10: T2 · axial · 5.0mm · 0.72mm/px · z∈[-117,+38]mm · 2 of 27 slices shown (1 of 3)]
[im 1/27]
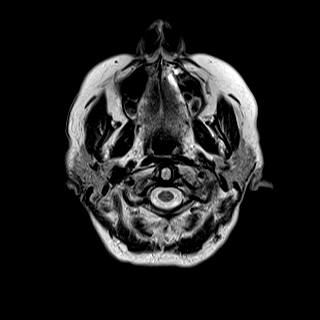
[im 27/27]
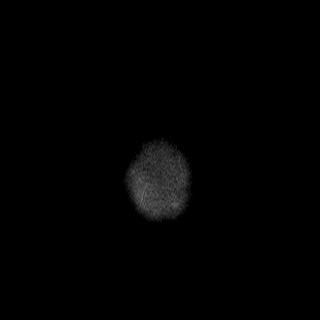

[Series 11: FLAIR · axial · 5.0mm · 0.45mm/px · z∈[-118,+37]mm · 2 of 27 slices shown (1 of 2)]
[im 1/27]
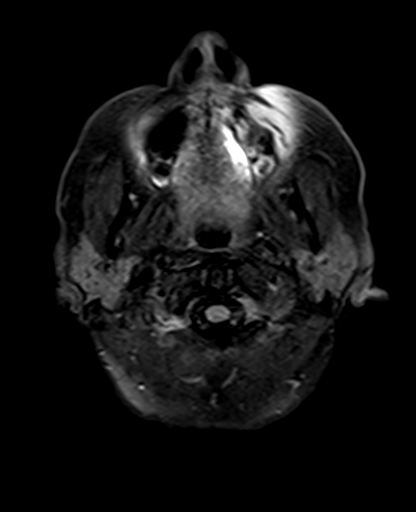
[im 27/27]
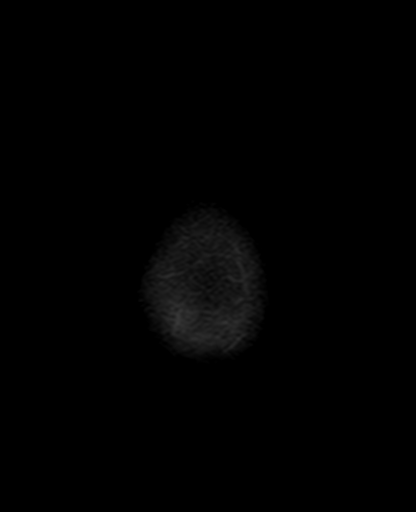

[Series 12: mag_images · axial · 3.0mm · 0.90mm/px · z∈[-111,+29]mm · 3 of 48 slices shown]
[im 1/48]
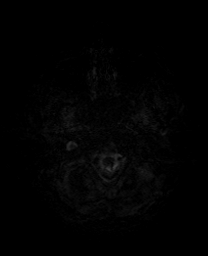
[im 24/48]
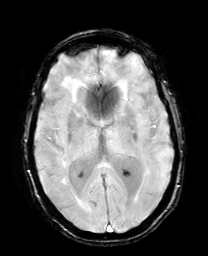
[im 48/48]
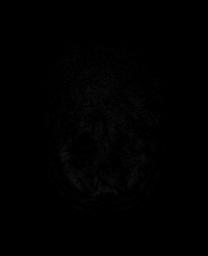

[Series 13: pha_images · axial · 3.0mm · 0.90mm/px · z∈[-111,+29]mm · 3 of 48 slices shown]
[im 1/48]
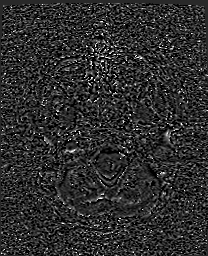
[im 24/48]
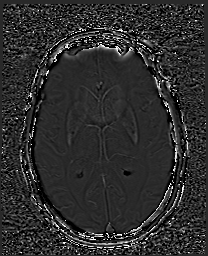
[im 48/48]
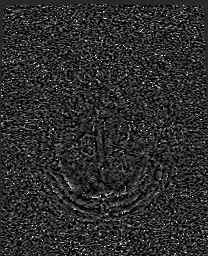

[Series 14: swi_images · axial · 3.0mm · 0.90mm/px · z∈[-111,+29]mm · 3 of 48 slices shown]
[im 1/48]
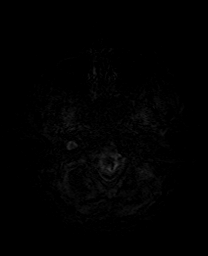
[im 24/48]
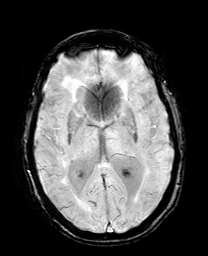
[im 48/48]
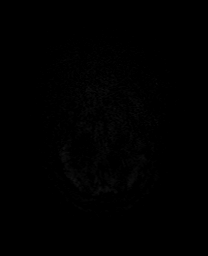

[Series 15: mip_images(sw) · axial · 24.0mm · 0.90mm/px · z∈[-100,-41]mm · 2 of 41 slices shown]
[im 1/41]
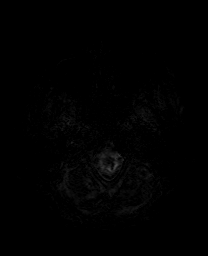
[im 21/41]
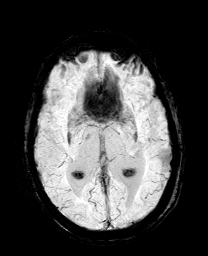

[Series 17: T2 · coronal · 5.0mm · 0.43mm/px · 2 of 30 slices shown (2 of 3)]
[im 1/30]
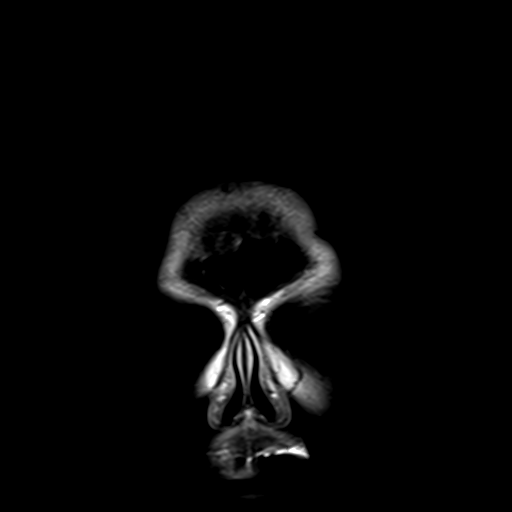
[im 30/30]
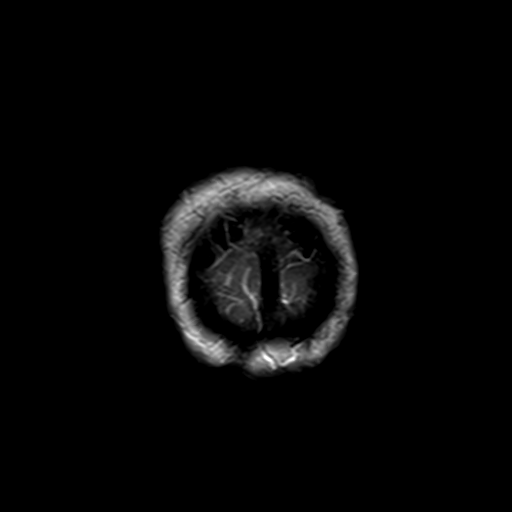

[Series 24: T2 · coronal · 3.0mm · 0.27mm/px · 3 of 36 slices shown (3 of 3)]
[im 1/36]
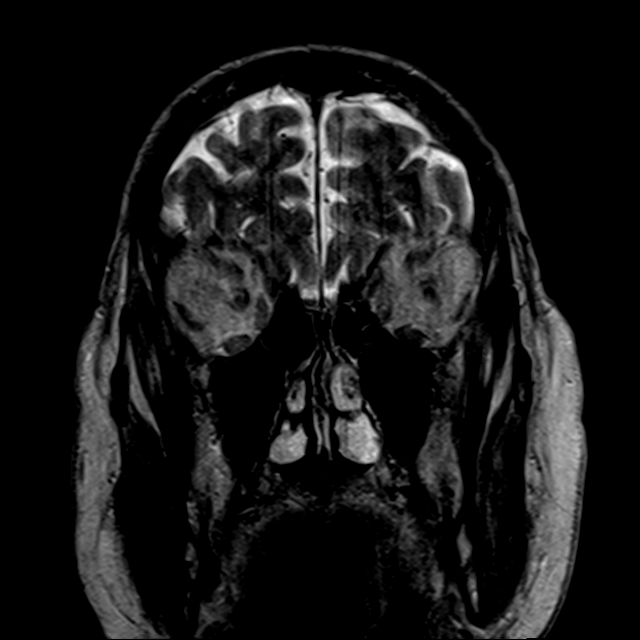
[im 18/36]
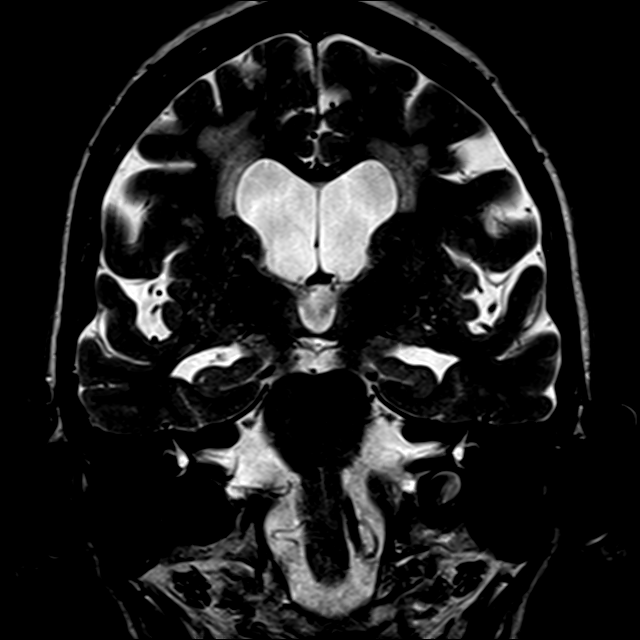
[im 36/36]
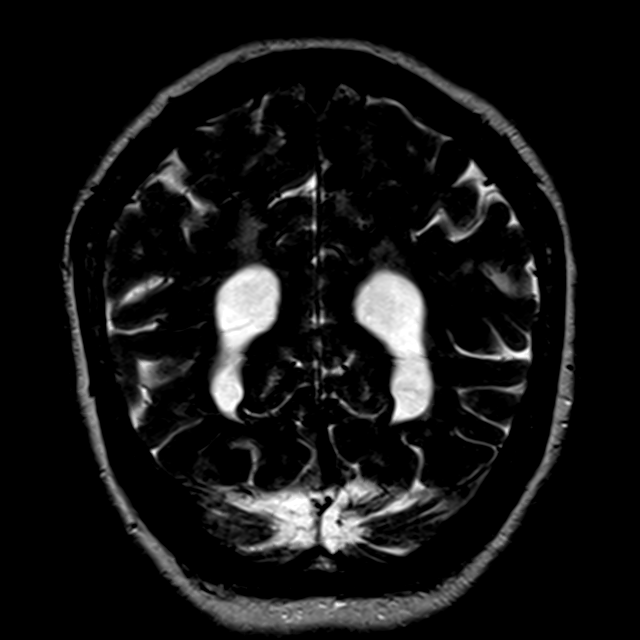

[Series 25: FLAIR · coronal · 3.0mm · 0.56mm/px · 3 of 38 slices shown (2 of 2)]
[im 1/38]
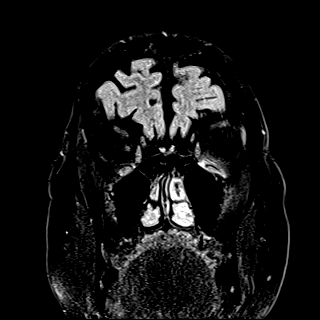
[im 19/38]
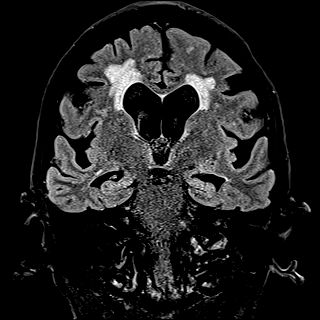
[im 38/38]
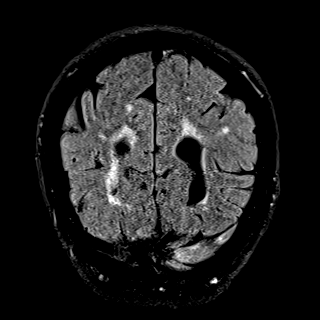

[44 of 48 positions shown; findings below may reference images not displayed]

FINDINGS: Brain: No acute infarct, acute hemorrhage or extra-axial collection.
Early confluent hyperintense T2-weighted signal of the
periventricular and deep white matter. There is generalized atrophy
without lobar predilection. No chronic microhemorrhage. Normal
midline structures.

Vascular: Major flow voids are preserved.

Skull and upper cervical spine: Normal calvarium and skull base.
Visualized upper cervical spine and soft tissues are normal.

Sinuses/Orbits:No paranasal sinus fluid levels or advanced mucosal
thickening. No mastoid or middle ear effusion. Normal orbits.
IMPRESSION: 1. No acute intracranial abnormality.
2. Generalized atrophy and findings of chronic ischemic
microangiopathy.

## 2020-03-17 MED ORDER — ENOXAPARIN SODIUM 40 MG/0.4ML ~~LOC~~ SOLN
40.0000 mg | SUBCUTANEOUS | Status: DC
  Administered 2020-03-18: 40 mg via SUBCUTANEOUS
  Filled 2020-03-17: qty 0.4

## 2020-03-17 MED ORDER — SODIUM BICARBONATE 8.4 % IV SOLN
INTRAVENOUS | Status: DC
  Filled 2020-03-17 (×3): qty 850

## 2020-03-17 MED ORDER — POTASSIUM CHLORIDE 20 MEQ PO PACK
40.0000 meq | PACK | Freq: Once | ORAL | Status: AC
  Administered 2020-03-17: 40 meq via ORAL
  Filled 2020-03-17: qty 2

## 2020-03-17 MED ORDER — VALPROIC ACID 250 MG PO CAPS
250.0000 mg | ORAL_CAPSULE | Freq: Two times a day (BID) | ORAL | Status: DC
  Administered 2020-03-17 – 2020-03-18 (×3): 250 mg via ORAL
  Filled 2020-03-17 (×3): qty 1

## 2020-03-17 NOTE — Progress Notes (Signed)
EEG completed, results pending. 

## 2020-03-17 NOTE — Plan of Care (Signed)

## 2020-03-17 NOTE — Progress Notes (Addendum)
PROGRESS NOTE    Isabel Zamora  CVE:[id number] DOB: 1930-01-20 DOA: 03/16/2020 PCP: Pcp, No     Brief Narrative:  This 86 years old female with known history of dementia, hypertension, hypothyroidism, dyslipidemia who presented in the emergency department with acute onset of tonic-clonic seizures at assisted living facility where she resides.  Patient was apparently found on the floor.  It is unclear if patient had head injury with her seizures.  She was slightly confused in between the seizures.  Noncontrast CT head revealed chronic ischemic microangiopathy and generalized atrophy without acute intracranial abnormalities. Patient was admitted for new onset seizures.  Neurology was consulted.  Patient was placed on as needed Ativan. MRI is pending, EEG no evidence of seizures.   Assessment & Plan:   Active Problems:   Seizure (HCC)  1.  New onset seizures. - She is admitted to  telemetry medical monitored bed. -Continue seizures precautions. -Continue as needed IV Ativan. -Continueneuro checks every 4 hours with 24 hours. -Neurology consulted recommended MRI and EEG. -EEG no evidence of seizures noted -MRI  Pending. -Patient had no further seizures while in hospital.   2.  Elevated lactic acid with lactic acidosis. -This could certainly be related to her seizures. -Lactic acid has trended down from 8.7-1.3. -She has no clear infectious etiology at this time. -She was started on IV bicarbonate drip. - dc bicarbonate drip. -BMPs will be followed.  3.  Mild acute kidney injury>> resolved -The patient will be hydrated with IV normal saline and will follow her BMP.  4.  Hypothyroidism.  continue Synthroid   5.  Dementia. continue Aricept.  6.  Essential hypertension. Continue as needed IV labetalol. Will resume home BP medications  7.  DVT prophylaxis. -Subtenons Lovenox.    DVT prophylaxis:  Lovenox. Code Status: Full code. Family Communication: No family at  bed side. Disposition Plan:  Status is: Inpatient  Remains inpatient appropriate because:Inpatient level of care appropriate due to severity of illness   Dispo: The patient is from: ALF              Anticipated d/c is to: ALF              Anticipated d/c date is: 1 day              Patient currently is not medically stable to d/c.    Consultants:   Neurology  Procedures: EEG, MRI Antimicrobials:  Anti-infectives (From admission, onward)   None     Subjective: Patient was seen and examined at bedside.  No overnight events.  Daughter was at bedside.  Patient appears improved.  Denies any concerns.  Objective: Vitals:   03/17/20 0924 03/17/20 1108 03/17/20 1250 03/17/20 1526  BP: 108/61 (!) 120/58 (!) 125/57 (!) 142/64  Pulse: 69 76 70 71  Resp: 18 15 18 18   Temp:   98.3 F (36.8 C) 98.3 F (36.8 C)  TempSrc:   Oral Oral  SpO2: 96% 97% 99% 99%  Weight:      Height:       No intake or output data in the 24 hours ending 03/17/20 1738 Filed Weights   03/16/20 2200  Weight: 48.5 kg    Examination:  General exam: Appears calm and comfortable , not in any distress Respiratory system: Clear to auscultation. Respiratory effort normal. Cardiovascular system: S1 & S2 heard, RRR. No JVD, murmurs, rubs, gallops or clicks. No pedal edema. Gastrointestinal system: Abdomen is nondistended, soft and nontender. No organomegaly  or masses felt. Normal bowel sounds heard. Central nervous system: Alert and oriented. No focal neurological deficits. Extremities: No edema, no cyanosis, no clubbing Skin: No rashes, lesions or ulcers Psychiatry: Judgement and insight appear normal. Mood & affect appropriate.     Data Reviewed: I have personally reviewed following labs and imaging studies  CBC: Recent Labs  Lab 03/16/20 1820 03/16/20 1831 03/16/20 1833 03/17/20 0306  WBC 12.2*  --   --  12.1*  NEUTROABS 7.9*  --   --   --   HGB 12.5 13.9 13.9 10.9*  HCT 40.9 41.0 41.0 34.4*   MCV 99.3  --   --  94.2  PLT 355  --   --  307   Basic Metabolic Panel: Recent Labs  Lab 03/16/20 1820 03/16/20 1831 03/16/20 1833 03/17/20 0033 03/17/20 0306  NA 137 137 137  --  136  K 3.2* 3.8 3.8  --  3.4*  CL 98 102  --   --  101  CO2 14*  --   --   --  21*  GLUCOSE 216* 208*  --   --  90  BUN 16 22  --   --  13  CREATININE 1.21* 0.90  --   --  0.72  CALCIUM 9.2  --   --   --  9.0  MG 2.0  --   --   --   --   PHOS  --   --   --  2.8  --    GFR: Estimated Creatinine Clearance: 37.2 mL/min (by C-G formula based on SCr of 0.72 mg/dL). Liver Function Tests: Recent Labs  Lab 03/16/20 1820 03/17/20 0306  AST 46* 37  ALT 26 23  ALKPHOS 90 72  BILITOT 0.6 0.5  PROT 7.0 6.0*  ALBUMIN 3.4* 3.1*   No results for input(s): LIPASE, AMYLASE in the last 168 hours. No results for input(s): AMMONIA in the last 168 hours. Coagulation Profile: No results for input(s): INR, PROTIME in the last 168 hours. Cardiac Enzymes: Recent Labs  Lab 03/17/20 0306  CKTOTAL 369*   BNP (last 3 results) No results for input(s): PROBNP in the last 8760 hours. HbA1C: No results for input(s): HGBA1C in the last 72 hours. CBG: No results for input(s): GLUCAP in the last 168 hours. Lipid Profile: No results for input(s): CHOL, HDL, LDLCALC, TRIG, CHOLHDL, LDLDIRECT in the last 72 hours. Thyroid Function Tests: Recent Labs    03/17/20 0033  TSH 0.844   Anemia Panel: No results for input(s): VITAMINB12, FOLATE, FERRITIN, TIBC, IRON, RETICCTPCT in the last 72 hours. Sepsis Labs: Recent Labs  Lab 03/16/20 2014 03/17/20 0033 03/17/20 0306 03/17/20 1000  LATICACIDVEN 8.7* 1.3 1.2 1.3    Recent Results (from the past 240 hour(s))  Resp Panel by RT-PCR (Flu A&B, Covid) Nasopharyngeal Swab     Status: None   Collection Time: 03/16/20  8:18 PM   Specimen: Nasopharyngeal Swab; Nasopharyngeal(NP) swabs in vial transport medium  Result Value Ref Range Status   SARS Coronavirus 2 by RT  PCR NEGATIVE NEGATIVE Final    Comment: (NOTE) SARS-CoV-2 target nucleic acids are NOT DETECTED.  The SARS-CoV-2 RNA is generally detectable in upper respiratory specimens during the acute phase of infection. The lowest concentration of SARS-CoV-2 viral copies this assay can detect is 138 copies/mL. A negative result does not preclude SARS-Cov-2 infection and should not be used as the sole basis for treatment or other patient management decisions. A negative result  may occur with  improper specimen collection/handling, submission of specimen other than nasopharyngeal swab, presence of viral mutation(s) within the areas targeted by this assay, and inadequate number of viral copies(<138 copies/mL). A negative result must be combined with clinical observations, patient history, and epidemiological information. The expected result is Negative.  Fact Sheet for Patients:  BloggerCourse.com  Fact Sheet for Healthcare Providers:  SeriousBroker.it  This test is no t yet approved or cleared by the [place] FDA and  has been authorized for detection and/or diagnosis of SARS-CoV-2 by FDA under an Emergency Use Authorization (EUA). This EUA will remain  in effect (meaning this test can be used) for the duration of the COVID-19 declaration under Section 564(b)(1) of the Act, 21 U.S.C.section 360bbb-3(b)(1), unless the authorization is terminated  or revoked sooner.       Influenza A by PCR NEGATIVE NEGATIVE Final   Influenza B by PCR NEGATIVE NEGATIVE Final    Comment: (NOTE) The Xpert Xpress SARS-CoV-2/FLU/RSV plus assay is intended as an aid in the diagnosis of influenza from Nasopharyngeal swab specimens and should not be used as a sole basis for treatment. Nasal washings and aspirates are unacceptable for Xpert Xpress SARS-CoV-2/FLU/RSV testing.  Fact Sheet for Patients: BloggerCourse.com  Fact Sheet for  Healthcare Providers: SeriousBroker.it  This test is not yet approved or cleared by the [place] FDA and has been authorized for detection and/or diagnosis of SARS-CoV-2 by FDA under an Emergency Use Authorization (EUA). This EUA will remain in effect (meaning this test can be used) for the duration of the COVID-19 declaration under Section 564(b)(1) of the Act, 21 U.S.C. section 360bbb-3(b)(1), unless the authorization is terminated or revoked.  Performed at [facility], [address], [place], [place] [address]      Radiology Studies: EEG  Result Date: 03/17/2020 Charlsie Quest, MD     03/17/2020 12:40 PM Patient Name: Isabel Zamora MRN: [id number] Epilepsy Attending: Charlsie Quest Referring Physician/Provider: Dr Valente David Date: 03/17/2020 Duration: 24.04 mins Patient history: 86 year old woman presented with first-time generalized tonic-clonic seizure.  EEG to evaluate for seizures. Level of alertness: Awake AEDs during EEG study: None Technical aspects: This EEG study was done with scalp electrodes positioned according to the 10-20 International system of electrode placement. Electrical activity was acquired at a sampling rate of 500Hz  and reviewed with a high frequency filter of 70Hz  and a low frequency filter of 1Hz . EEG data were recorded continuously and digitally stored. Description: The posterior dominant rhythm consists of 9-10 Hz activity of moderate voltage (25-35 uV) seen predominantly in posterior head regions, symmetric and reactive to eye opening and eye closing. EEG showed continuous 3 to 6 Hz theta-delta slowing in right temporal region.  Sharp transients were also seen in right temporal region.  Hyperventilation and photic stimulation were not performed.   ABNORMALITY -Continuous slow, right temporal region IMPRESSION: This study is suggestive of cortical dysfunction in the right temporal region which could be secondary to underlying  structural abnormality, postictal state.  No seizures or definite epileptiform discharges were seen throughout the recording.   CT Head Wo Contrast  Result Date: 03/16/2020 CLINICAL DATA:  Seizure and fall EXAM: CT HEAD WITHOUT CONTRAST CT CERVICAL SPINE WITHOUT CONTRAST TECHNIQUE: Multidetector CT imaging of the head and cervical spine was performed following the standard protocol without intravenous contrast. Multiplanar CT image reconstructions of the cervical spine were also generated. COMPARISON:  None. FINDINGS: CT HEAD FINDINGS Brain: There is  no mass, hemorrhage or extra-axial collection. There is generalized atrophy without lobar predilection. There is hypoattenuation of the periventricular white matter, most commonly indicating chronic ischemic microangiopathy. Vascular: No abnormal hyperdensity of the major intracranial arteries or dural venous sinuses. No intracranial atherosclerosis. Skull: The visualized skull base, calvarium and extracranial soft tissues are normal. Sinuses/Orbits: No fluid levels or advanced mucosal thickening of the visualized paranasal sinuses. No mastoid or middle ear effusion. The orbits are normal. CT CERVICAL SPINE FINDINGS Alignment: No static subluxation. Facets are aligned. Occipital condyles are normally positioned. Skull base and vertebrae: No acute fracture. Soft tissues and spinal canal: No prevertebral fluid or swelling. No visible canal hematoma. Disc levels: No advanced spinal canal or neural foraminal stenosis. Multilevel degenerative disc disease. Upper chest: No pneumothorax, pulmonary nodule or pleural effusion. Other: Normal visualized paraspinal cervical soft tissues. IMPRESSION: 1. Chronic ischemic microangiopathy and generalized atrophy without acute intracranial abnormality. 2. No acute fracture or static subluxation of the cervical spine. Electronically Signed   By: Deatra Robinson M.D.   On: 03/16/2020 20:20   CT Cervical Spine Wo  Contrast  Result Date: 03/16/2020 CLINICAL DATA:  Seizure and fall EXAM: CT HEAD WITHOUT CONTRAST CT CERVICAL SPINE WITHOUT CONTRAST TECHNIQUE: Multidetector CT imaging of the head and cervical spine was performed following the standard protocol without intravenous contrast. Multiplanar CT image reconstructions of the cervical spine were also generated. COMPARISON:  None. FINDINGS: CT HEAD FINDINGS Brain: There is no mass, hemorrhage or extra-axial collection. There is generalized atrophy without lobar predilection. There is hypoattenuation of the periventricular white matter, most commonly indicating chronic ischemic microangiopathy. Vascular: No abnormal hyperdensity of the major intracranial arteries or dural venous sinuses. No intracranial atherosclerosis. Skull: The visualized skull base, calvarium and extracranial soft tissues are normal. Sinuses/Orbits: No fluid levels or advanced mucosal thickening of the visualized paranasal sinuses. No mastoid or middle ear effusion. The orbits are normal. CT CERVICAL SPINE FINDINGS Alignment: No static subluxation. Facets are aligned. Occipital condyles are normally positioned. Skull base and vertebrae: No acute fracture. Soft tissues and spinal canal: No prevertebral fluid or swelling. No visible canal hematoma. Disc levels: No advanced spinal canal or neural foraminal stenosis. Multilevel degenerative disc disease. Upper chest: No pneumothorax, pulmonary nodule or pleural effusion. Other: Normal visualized paraspinal cervical soft tissues. IMPRESSION: 1. Chronic ischemic microangiopathy and generalized atrophy without acute intracranial abnormality. 2. No acute fracture or static subluxation of the cervical spine. Electronically Signed   By: Deatra Robinson M.D.   On: 03/16/2020 20:20   DG Pelvis Portable  Result Date: 03/16/2020 CLINICAL DATA:  Fall.  Pelvic pain.  Initial encounter. EXAM: PORTABLE PELVIS 1-2 VIEWS COMPARISON:  None. FINDINGS: There is no  evidence of pelvic fracture or diastasis. No pelvic bone lesions are seen. IMPRESSION: Negative. Electronically Signed   By: Danae Orleans M.D.   On: 03/16/2020 19:10   DG Chest Portable 1 View  Result Date: 03/16/2020 CLINICAL DATA:  Altered level of consciousness.  Fall. EXAM: PORTABLE CHEST 1 VIEW COMPARISON:  02/03/2020 FINDINGS: The heart size and mediastinal contours are within normal limits. Aortic atherosclerotic calcification noted. Both lungs are clear. No evidence of pneumothorax or pleural effusion. Severe S-shaped thoracolumbar scoliosis is again noted. IMPRESSION: No active disease. Electronically Signed   By: Danae Orleans M.D.   On: 03/16/2020 19:09    Scheduled Meds: . aspirin EC  81 mg Oral Daily  . cholecalciferol  1,000 Units Oral Daily  . donepezil  10 mg Oral QHS  . [  START ON 03/18/2020] enoxaparin (LOVENOX) injection  40 mg Subcutaneous Q24H  . levothyroxine  25 mcg Oral Once per day on Mon Wed Fri  . [START ON 03/18/2020] levothyroxine  50 mcg Oral Once per day on Sun Tue Thu Sat  . mirtazapine  7.5 mg Oral QHS  . multivitamin with minerals  1 tablet Oral Daily  . valproic acid  250 mg Oral BID   Continuous Infusions: . sodium chloride 75 mL/hr (03/17/20 1353)  . sodium bicarbonate (isotonic) 150 mEq in D5W 1000 mL infusion 100 mL/hr at 03/17/20 0333     LOS: 1 day    Time spent: 25 mins    Solyana Nonaka, MD [facility]   If 7PM-7AM, please contact night-coverage

## 2020-03-17 NOTE — Consult Note (Addendum)
Neurology Consultation Reason for Consult: Seizure Requesting Physician: Valente David  CC: Seizure   History is obtained from: Chart review and patient's daughter at bedside   HPI: Isabel Zamora is a 86 y.o. female with a PMHx of dementia (oriented to self only at baseline, ambulatory with walker, lives in a nursing home, able to bathe herself), hypertension, hyperlipidemia, hypothyroid  Patient's daughter reports that she was called by the facility today as they had walked into the room and found her mother on the ground shaking.  They FaceTime video called her and she was able to see that the patient was laying down with generalized shaking.  This lasted about 5 to 6 minutes and resolved.  She was able to moan in response to being asked her name, and then she had a second event, at which time decision was made to bring her to the ED for further evaluation.  She was given Versed by EMS and had to be bagged due to shallow respirations.  By the time her daughter was able to reach bedside 1-1/2 hours later, she reports that other than sleepiness from the Versed her mother is back to her baseline level of being able to converse, move all extremities etc.  Of note she was recently hospitalized 10/15 and originally presented as a code stroke for right-sided weakness and aphasia.  However work-up was notable for negative MRI brain and she was found to have a UTI   ROS: Unable to obtain due to altered mental status, but daughter reports that the patient has been her normal self lately and had Thanksgiving etc. with family without issue.  At baseline she has falls 1-2 times a month  Past Medical History:  Diagnosis Date  . Dementia (HCC)   . HLD (hyperlipidemia)   . HTN (hypertension)   . Hypothyroidism   . Kyphosis    No past surgical history on file.  Current Outpatient Medications  Medication Instructions  . acetaminophen (TYLENOL) 500 mg, Oral, Every 6 hours PRN  . aspirin EC 81 mg, Oral,  Daily, Swallow whole.  . cholecalciferol (VITAMIN D3) 1,000 Units, Oral, Daily  . donepezil (ARICEPT) 10 mg, Oral, Daily at bedtime  . Ensure (ENSURE) 237 mLs, Oral, Daily PRN  . levothyroxine (SYNTHROID) 25 mcg, Oral, Every M-W-F  . levothyroxine (SYNTHROID) 50 mcg, Oral, Every T-Th-S-Su  . mirtazapine (REMERON) 7.5 mg, Oral, Daily at bedtime  . Multiple Vitamins-Minerals (ALIVE WOMENS GUMMY) CHEW 1 tablet, Oral, Daily    Family history: No history of seizures  Social History:  reports that she has never smoked. She has never used smokeless tobacco. She reports that she does not drink alcohol and does not use drugs.   Exam: Current vital signs: BP 124/80   Pulse (!) 107   Temp 98 F (36.7 C) (Oral)   Resp 20   Ht 5\' 2"  (1.575 m)   Wt 48.5 kg Comment: from 2020 records, please update  SpO2 98%   BMI 19.57 kg/m  Vital signs in last 24 hours: Temp:  [98 F (36.7 C)] 98 F (36.7 C) (11/30 1810) Pulse Rate:  [99-130] 107 (12/01 0037) Resp:  [16-24] 20 (12/01 0037) BP: (109-141)/(66-80) 124/80 (12/01 0037) SpO2:  [98 %-100 %] 98 % (12/01 0037) Weight:  [48.5 kg] 48.5 kg (11/30 2200)   Physical Exam  Constitutional: Appears well-developed and well-nourished.  Psych: Affect appropriate to situation Eyes: No scleral injection HENT: No OP obstrucion MSK: no joint deformities.  Cardiovascular: Normal rate and  regular rhythm.  Respiratory: Effort normal, non-labored breathing GI: Soft.  No distension. There is no tenderness.  Skin: WDI  Neuro: Mental Status: Somnolent.  Opens eyes briefly with repeated noxious stimulation.  Follows some simple commands such as stick out your tongue.  Minimal verbal output, but does say ow when pinched Cranial Nerves: II: Pupils are equal, round, and reactive to light.   III,IV, VI: EOMI for lateral gaze V: Facial sensation is symmetric to eyelash brush VII: Facial movement is symmetric.  VIII: hearing is intact to  voice Motor/sensory She is moving all extremities equally and freely antigravity to light noxious stimulation.  She is equally responsive to light noxious stimulation in all 4 extremities. Deep Tendon Reflexes: 3+ and symmetric in the biceps and patellae.  Plantars: Toes are downgoing bilaterally.   I have reviewed labs in epic and the results pertinent to this consultation are: Mild leukocytosis to approximately 12 Acidemia on VBG (7.126), lactate greater than 11 on admission now downtrending to 8.7 AKI to 1.2 (baseline 0.67)  I personally reviewed head CT which does not reveal any acute intracranial process  Impression: This is an 86 year old woman presenting with first-time generalized tonic-clonic seizure.  I suspect most likely focal seizure with secondary generalization but her examination today does not have any clear focal deficits (though significantly limited by her mental status post Versed and postevent).  Question whether her event on 10/15 may have also been a focal seizure.  No lumbar puncture at this time given the patient is improving, but daughter reports that this would be within goals of care to guide antibiotic treatment if needed; would obtain lumbar puncture if the patient declines or becomes febrile.  There does not seem to be a clear provoking cause for this event, which does make me more inclined to start an AED, though I did discuss with the daughter that it could be reasonable to hold off on AEDs unless she has a second event unless the work-up is concerning for high likelihood of recurrent events (new structural lesion, or epileptogenic activity on EEG)  Recommendations: -MRI brain -EEG -Will consider initiation of antiseizure medications based on these results -CK added on and repeat CK for morning labs to evaluate for potential rhabdo contributing to AKI -Remainder of AKI management per primary team  Brooke Dare MD-PhD [facility] [phone]

## 2020-03-17 NOTE — Progress Notes (Addendum)
Same day progress note  No clinical seizures overnight Pending EEG and MRI If any abnormalities noted on the above, will consider antiepileptic-likely Depakote due to underlying dementia. We will follow studies. Discussed plan with the daughter at bedside.  -- Milon Dikes, MD [facility] Pager: [phone] If 7pm to 7am, please call on call as listed on AMION.    Addendum MRI-pending  EEG: IMPRESSION: This study is suggestive of cortical dysfunction in the right temporal region which could be secondary to underlying structural abnormality, postictal state.  No seizures or definite epileptiform discharges were seen throughout the recording.  UPDATED RECS: -With EEG focality, will prefer to start on Depakote (250mg  BID-no load). Discussed with daughter, who agrees.  Will follow. MRI is pending.  -- , MD [facility] Pager: [phone] If 7pm to 7am, please call on call as listed on AMION.

## 2020-03-17 NOTE — Procedures (Signed)
Patient Name: Isabel Zamora  MRN: [id number]  Epilepsy Attending: Charlsie Quest  Referring Physician/Provider: Dr Valente David Date: 03/17/2020 Duration: 24.04 mins  Patient history: 86 year old woman presented with first-time generalized tonic-clonic seizure.  EEG to evaluate for seizures.  Level of alertness: Awake  AEDs during EEG study: None  Technical aspects: This EEG study was done with scalp electrodes positioned according to the 10-20 International system of electrode placement. Electrical activity was acquired at a sampling rate of 500Hz  and reviewed with a high frequency filter of 70Hz  and a low frequency filter of 1Hz . EEG data were recorded continuously and digitally stored.   Description: The posterior dominant rhythm consists of 9-10 Hz activity of moderate voltage (25-35 uV) seen predominantly in posterior head regions, symmetric and reactive to eye opening and eye closing. EEG showed continuous 3 to 6 Hz theta-delta slowing in right temporal region.  Sharp transients were also seen in right temporal region.  Hyperventilation and photic stimulation were not performed.     ABNORMALITY -Continuous slow, right temporal region  IMPRESSION: This study is suggestive of cortical dysfunction in the right temporal region which could be secondary to underlying structural abnormality, postictal state.  No seizures or definite epileptiform discharges were seen throughout the recording.  Isabel Zamora 

## 2020-03-18 DIAGNOSIS — N179 Acute kidney failure, unspecified: Secondary | ICD-10-CM | POA: Diagnosis not present

## 2020-03-18 DIAGNOSIS — R569 Unspecified convulsions: Secondary | ICD-10-CM | POA: Diagnosis not present

## 2020-03-18 LAB — BASIC METABOLIC PANEL
Anion gap: 10 (ref 5–15)
BUN: 5 mg/dL — ABNORMAL LOW (ref 8–23)
CO2: 29 mmol/L (ref 22–32)
Calcium: 8.8 mg/dL — ABNORMAL LOW (ref 8.9–10.3)
Chloride: 101 mmol/L (ref 98–111)
Creatinine, Ser: 0.66 mg/dL (ref 0.44–1.00)
GFR, Estimated: >60 mL/min (ref >60)
Glucose, Bld: 85 mg/dL (ref 70–99)
Potassium: 3.9 mmol/L (ref 3.5–5.1)
Sodium: 140 mmol/L (ref 135–145)

## 2020-03-18 LAB — CBC
HCT: 33.8 % — ABNORMAL LOW (ref 36.0–46.0)
Hemoglobin: 10.9 g/dL — ABNORMAL LOW (ref 12.0–15.0)
MCH: 30 pg (ref 26.0–34.0)
MCHC: 32.2 g/dL (ref 30.0–36.0)
MCV: 93.1 fL (ref 80.0–100.0)
Platelets: 276 10*3/uL (ref 150–400)
RBC: 3.63 MIL/uL — ABNORMAL LOW (ref 3.87–5.11)
RDW: 14.2 % (ref 11.5–15.5)
WBC: 8.6 10*3/uL (ref 4.0–10.5)
nRBC: 0 % (ref 0.0–0.2)

## 2020-03-18 LAB — MAGNESIUM: Magnesium: 1.8 mg/dL (ref 1.7–2.4)

## 2020-03-18 LAB — PHOSPHORUS: Phosphorus: 2.9 mg/dL (ref 2.5–4.6)

## 2020-03-18 MED ORDER — VALPROIC ACID 250 MG PO CAPS: 250.0000 mg | ORAL_CAPSULE | Freq: Two times a day (BID) | ORAL | 1 refills | Status: AC

## 2020-03-18 NOTE — Progress Notes (Signed)
Occupational Therapy Evaluation Patient Details Name: Isabel Zamora MRN: [id number] DOB: 1930-04-04 Today's Date: 03/18/2020    History of Present Illness 86 yo female with a PMHx of dementia who lives in a SNF, HTN, HLD, and Hypothyroidism. Ms Bultman was admitted on 12/1 after being found on the ground shaking by staff at the SNF. Generalized shaking noted which lasted about 5 minutes. She had a second event and was brought to ED by EMS. After Versed wore off, pt was back to baseline mental status. No previous hx of seizures. EEG was suggestive of cortical dysfunction in the right temporal region. No seizures or definite epileptiform discharges were seen. Given the assymetry of dysfunction, an MRI was ordered which was negative. Given EEG focality, Depakote 250mg  bid was started.    Clinical Impression   PTA, pt living at ALF ([facility]) and was modified independent with mobility using her rollator. An aid assists with bathing and supervises dressing due to dementia. Pt currently requires min A with gait to prevent falls. Daughter present during session and educated on need for initial 24/7 S and direct physical assistance with mobility (issued gait belt). Given pt's dementia and likelihood of developing hospital delirium, will be best for pt to DC back to ALF as soon as medically stable. Discussed with daughter who is in agreement. Daughter is able to set up 24/7 assistance beginning tonight if needed. PT will need HHOT/PT.     Follow Up Recommendations  Home health OT;Supervision/Assistance - 24 hour    Equipment Recommendations  None recommended by OT    Recommendations for Other Services       Precautions / Restrictions Precautions Precautions: Fall      Mobility Bed Mobility Overal bed mobility: Needs Assistance Bed Mobility: Supine to Sit     Supine to sit: Min assist Sit to supine: Supervision        Transfers Overall transfer level: Needs assistance Equipment used:  Rolling walker (2 wheeled) Transfers: Sit to/from Stand Sit to Stand: Min guard              Balance Overall balance assessment: Needs assistance;History of Falls   Sitting balance-Leahy Scale: Fair       Standing balance-Leahy Scale: Poor Standing balance comment: able to release RW at sink to brush hair. When distracted, LOB posteriorly                           ADL either performed or assessed with clinical judgement   ADL Overall ADL's : Needs assistance/impaired     Grooming: Min guard;Standing   Upper Body Bathing: Set up;Sitting   Lower Body Bathing: Min guard;Sit to/from stand   Upper Body Dressing : Set up;Sitting   Lower Body Dressing: Min guard;Sit to/from stand   Toilet Transfer: Minimal assistance;Ambulation;RW   Toileting- Clothing Manipulation and Hygiene: Supervision/safety       Functional mobility during ADLs: Minimal assistance;Rolling walker;Cueing for safety General ADL Comments: Using RW and pt trying to "lock/unlock" brakes as that is her norm. Trying to take off hospital gown as it is not what she is use to      Vision         Perception     Praxis      Pertinent Vitals/Pain Pain Assessment: Faces Faces Pain Scale: Hurts a little bit Pain Location: back of head Pain Descriptors / Indicators: Discomfort Pain Intervention(s): Limited activity within patient's tolerance     Hand  Dominance Right   Extremity/Trunk Assessment Upper Extremity Assessment Upper Extremity Assessment: Overall WFL for tasks assessed   Lower Extremity Assessment Lower Extremity Assessment: Defer to PT evaluation   Cervical / Trunk Assessment Cervical / Trunk Assessment: Kyphotic   Communication Communication Communication: No difficulties   Cognition Arousal/Alertness: Awake/alert Behavior During Therapy: WFL for tasks assessed/performed Overall Cognitive Status: History of cognitive impairments - at baseline                                  General Comments: Per daughter pt is at her baseline. States she was "sundowning last night" and is concerned that she will continue to worsen the longer she stays in the hospital   General Comments       Exercises     Shoulder Instructions      Home Living Family/patient expects to be discharged to:: Assisted living                             Home Equipment: Walker - 4 wheels;Walker - 2 wheels;Shower seat - built in;Grab bars - tub/shower;Grab bars - toilet;Hand held shower head ([facility])          Prior Functioning/Environment Level of Independence: Needs assistance  Gait / Transfers Assistance Needed: Moves within apt alone using rollator; dtr states she uses rollator in community ADL's / Homemaking Assistance Needed: Aids help with bathing  - mostly cues due to dementia; able to dress herself wtih S;mobilizes tobathroom adn completes pericare independently; occaisionally has accidents            OT Problem List: Decreased activity tolerance;Impaired balance (sitting and/or standing);Decreased safety awareness;Decreased cognition;Decreased knowledge of use of DME or AE;Pain      OT Treatment/Interventions: Self-care/ADL training;Therapeutic exercise;Neuromuscular education;Energy conservation;DME and/or AE instruction;Therapeutic activities;Cognitive remediation/compensation;Patient/family education;Balance training    OT Goals(Current goals can be found in the care plan section) Acute Rehab OT Goals Patient Stated Goal: daughter wants pt to return to ALF OT Goal Formulation: With family Time For Goal Achievement: 04/01/20 Potential to Achieve Goals: Good  OT Frequency: Min 2X/week   Barriers to D/C:            Co-evaluation              AM-PAC OT "6 Clicks" Daily Activity     Outcome Measure Help from another person eating meals?: None Help from another person taking care of personal grooming?: A Little Help from  another person toileting, which includes using toliet, bedpan, or urinal?: A Little Help from another person bathing (including washing, rinsing, drying)?: A Little Help from another person to put on and taking off regular upper body clothing?: A Little Help from another person to put on and taking off regular lower body clothing?: A Little 6 Click Score: 19   End of Session Equipment Utilized During Treatment: Gait belt;Rolling walker Nurse Communication: Mobility status;Other (comment) (DC needs)  Activity Tolerance: Patient tolerated treatment well Patient left: in bed;with call bell/phone within reach;with bed alarm set;with family/visitor present  OT Visit Diagnosis: Unsteadiness on feet (R26.81);Other abnormalities of gait and mobility (R26.89);Muscle weakness (generalized) (M62.81);History of falling (Z91.81);Other symptoms and signs involving cognitive function;Pain Pain - part of body:  (back of head)                Time: 9702-6378 OT Time Calculation (min): 29 min Charges:  OT General Charges $OT Visit: 1 Visit OT Evaluation $OT Eval Moderate Complexity: 1 Mod OT Treatments $Self Care/Home Management : 8-22 mins  Luisa Dago, OT/L   Acute OT Clinical Specialist Acute Rehabilitation Services Pager [phone] Office [phone]   [facility] 03/18/2020, 1:40 PM

## 2020-03-18 NOTE — TOC Transition Note (Signed)
Transition of Care [facility]) - CM/SW Discharge Note   Patient Details  Name: Isabel Zamora MRN: [id number] Date of Birth: 1929/11/08  [facility] CM/SW Contact:  Kermit Balo, RN Phone Number: 03/18/2020, 3:19 PM   Clinical Narrative:    Pt discharging back to [facility] today. Daughter has arranged for caregivers at the facility. Orders for [facility] services faxed to [facility]. Monique with [facility] viewed FL2 and is good with return.  Daughter to provide transport.    Final next level of care: Assisted Living Barriers to Discharge: No Barriers Identified   Patient Goals and CMS Choice   CMS Medicare.gov Compare Post Acute Care list provided to:: Patient Choice offered to / list presented to : Patient, Adult Children  Discharge Placement                       Discharge Plan and Services                          HH Arranged: PT, OT       Representative spoke with at [facility] Agency: Orders sent to [facility] they will arrange  Social Determinants of Health (SDOH) Interventions     Readmission Risk Interventions No flowsheet data found.

## 2020-03-18 NOTE — Evaluation (Signed)
Physical Therapy Evaluation Patient Details Name: Isabel Zamora MRN: [id number] DOB: 03-28-1930 Today's Date: 03/18/2020   History of Present Illness  86 yo female with a PMHx of dementia who lives in a SNF, HTN, HLD, and Hypothyroidism. Isabel Zamora was admitted on 12/1 after being found on the ground shaking by staff at the SNF. Generalized shaking noted which lasted about 5 minutes. She had a second event and was brought to ED by EMS. After Versed wore off, pt was back to baseline mental status. No previous hx of seizures. EEG was suggestive of cortical dysfunction in the right temporal region. No seizures or definite epileptiform discharges were seen. Given the assymetry of dysfunction, an MRI was ordered which was negative. Given EEG focality, Depakote 250mg  bid was started.   Clinical Impression  Pt presents to hospital due to episodes listed above with deficits mentioned below, see PT Problem List. Pt was living at an ALF and receiving PT services there following her recent hospitalization. She was independent with use of a rollator for all functional mobility. She has a history of cognitive deficits, in which the pt's daughter reports that the pt is having slightly more difficulty following cues compared to baseline. Also, daughter reports pt is demonstrating increased balance and strength deficits compared to baseline. She required supervision for all bed mobility simulated to her home set-up, but minA to steady herself when coming to stand due to a posterior trunk sway. Intermittent minA but otherwise only min guard assist required to ambulate with a RW this date. This is possibly due to her impaired dynamic proprioception, muscular strength, and coordination in her legs. The patient is at risk for falls and injury, supported by her DGI score of 11 this date. Will continue to follow acutely. Recommending pt have 24/7 supervision/assist initially until her cognition returns to baseline and she can perform  transfers and ambulate with increased safety. Recommending follow-up with prior PT company pt had been receiving prior to current hospitalization.     Follow Up Recommendations Outpatient PT;Supervision/Assistance - 24 hour;Supervision for mobility/OOB (initial 24/7 SUP for safety, decrease as pt gets to baseline)    Equipment Recommendations  None recommended by PT    Recommendations for Other Services       Precautions / Restrictions Precautions Precautions: Fall Restrictions Weight Bearing Restrictions: No      Mobility  Bed Mobility Overal bed mobility: Needs Assistance Bed Mobility: Supine to Sit;Sit to Supine     Supine to sit: Supervision Sit to supine: Supervision   General bed mobility comments: Supervision for safety with pt able to transition supine <> sit with bed flat and no use of bed rails to simulate home set-up. Extra time and 2 attempts to be successful with supine > sit.    Transfers Overall transfer level: Needs assistance Equipment used: Rolling walker (2 wheeled) Transfers: Sit to/from Stand Sit to Stand: Min assist         General transfer comment: Pt comes to stand quickly with a posterior lean and LOB, resulting in minA to recover.  Ambulation/Gait Ambulation/Gait assistance: Min assist Gait Distance (Feet): 200 Feet Assistive device: Rolling walker (2 wheeled) Gait Pattern/deviations: Step-through pattern;Narrow base of support;Trunk flexed Gait velocity: WFL Gait velocity interpretation: 1.31 - 2.62 ft/sec, indicative of limited community ambulator General Gait Details: Per daughter, pt has increased balance deficits compared to baseline. Min guard assist majority of time but intermittent minA during bouts of increased trunk sway. Able to keep RW proximal to body  appropriately. Pt attempted to squeeze handles like on a rollator and states repeatedly that the breaks must be on on the RW. Required repeated education that this was a RW not  rollator.  Stairs            Wheelchair Mobility    Modified Rankin (Stroke Patients Only) Modified Rankin (Stroke Patients Only) Pre-Morbid Rankin Score: Moderate disability Modified Rankin: Moderately severe disability     Balance Overall balance assessment: Needs assistance;History of Falls Sitting-balance support: Feet supported;Bilateral upper extremity supported Sitting balance-Leahy Scale: Poor Sitting balance - Comments: UE support with intermittent cues to lean anteriorly as she tends to lean posteriorly, min guard assist. Postural control: Posterior lean Standing balance support: Bilateral upper extremity supported;No upper extremity supported;During functional activity Standing balance-Leahy Scale: Poor Standing balance comment: Initially no UE support with mod trunk sway and minA-min guard thus pt cued to place hands on RW for steadying.                 Standardized Balance Assessment Standardized Balance Assessment : Dynamic Gait Index   Dynamic Gait Index Level Surface: Moderate Impairment Change in Gait Speed: Mild Impairment Gait with Horizontal Head Turns: Mild Impairment Gait with Vertical Head Turns: Mild Impairment Gait and Pivot Turn: Mild Impairment Step Over Obstacle: Moderate Impairment Step Around Obstacles: Severe Impairment Steps: Moderate Impairment Total Score: 11       Pertinent Vitals/Pain Pain Assessment: No/denies pain Faces Pain Scale: Hurts a little bit Pain Location: back of head Pain Descriptors / Indicators: Discomfort Pain Intervention(s): Limited activity within patient's tolerance;Monitored during session;Repositioned    Home Living Family/patient expects to be discharged to:: Assisted living               Home Equipment: Walker - 4 wheels;Walker - 2 wheels;Shower seat - built in;Grab bars - tub/shower;Grab bars - toilet;Hand held shower head;Wheelchair - manual Additional Comments: pt used rollator at ALF -  daughter said for balance    Prior Function Level of Independence: Needs assistance   Gait / Transfers Assistance Needed: Moves within apt alone using rollator; dtr states she uses rollator in community  ADL's / Homemaking Assistance Needed: Aids help with bathing  - mostly cues due to dementia; able to dress herself wtih S;mobilizes tobathroom adn completes pericare independently; occaisionally has accidents        Hand Dominance   Dominant Hand: Right    Extremity/Trunk Assessment   Upper Extremity Assessment Upper Extremity Assessment: Defer to OT evaluation    Lower Extremity Assessment Lower Extremity Assessment: RLE deficits/detail;LLE deficits/detail RLE Deficits / Details: MMT scores of the following noted: hip flexion 4, knee extension 5, knee flexion 4, ankle dorsiflexion 5 RLE Sensation: decreased proprioception (delayed dyn proprio, but maybe due to poor follow directions) RLE Coordination: decreased fine motor (out of synchrony with tapping foot as speed increased) LLE Deficits / Details: MMT scores of the following noted: hip flexion 4, knee extension 5, knee flexion 4, ankle dorsiflexion 5 LLE Sensation: decreased proprioception (delayed dyn proprio, but maybe due to poor follow directions) LLE Coordination: decreased fine motor (out of synchrony with tapping foot as speed increased)    Cervical / Trunk Assessment Cervical / Trunk Assessment: Kyphotic  Communication   Communication: No difficulties  Cognition Arousal/Alertness: Awake/alert Behavior During Therapy: WFL for tasks assessed/performed Overall Cognitive Status: History of cognitive impairments - at baseline Area of Impairment: Following commands;Safety/judgement;Memory  Memory: Decreased short-term memory Following Commands: Follows one step commands inconsistently;Follows one step commands with increased time;Follows multi-step commands inconsistently Safety/Judgement:  Decreased awareness of safety;Decreased awareness of deficits     General Comments: Per daughter, pt is about at her basline and requires slightly increased cues to follow directions correctly. Pt oriented to person and place but requires cues for situation and does not know date (which is her baseline, per daughter).        General Comments      Exercises     Assessment/Plan    PT Assessment Patient needs continued PT services  PT Problem List Decreased strength;Decreased mobility;Decreased safety awareness;Decreased knowledge of precautions;Decreased activity tolerance;Decreased cognition;Decreased balance;Decreased knowledge of use of DME;Decreased coordination       PT Treatment Interventions DME instruction;Therapeutic activities;Cognitive remediation;Gait training;Therapeutic exercise;Patient/family education;Balance training;Functional mobility training;Neuromuscular re-education    PT Goals (Current goals can be found in the Care Plan section)  Acute Rehab PT Goals Patient Stated Goal: daughter wants pt to return to ALF with prior PT services again PT Goal Formulation: With patient/family Time For Goal Achievement: 04/01/20 Potential to Achieve Goals: Good    Frequency Min 3X/week   Barriers to discharge        Co-evaluation               AM-PAC PT "6 Clicks" Mobility  Outcome Measure Help needed turning from your back to your side while in a flat bed without using bedrails?: A Little Help needed moving from lying on your back to sitting on the side of a flat bed without using bedrails?: A Little Help needed moving to and from a bed to a chair (including a wheelchair)?: A Little Help needed standing up from a chair using your arms (e.g., wheelchair or bedside chair)?: A Little Help needed to walk in hospital room?: A Little Help needed climbing 3-5 steps with a railing? : A Lot 6 Click Score: 17    End of Session Equipment Utilized During Treatment: Gait  belt Activity Tolerance: Patient tolerated treatment well Patient left: in bed;with call bell/phone within reach;with bed alarm set;with family/visitor present Nurse Communication: Mobility status PT Visit Diagnosis: Unsteadiness on feet (R26.81);Other symptoms and signs involving the nervous system (R29.898);Other abnormalities of gait and mobility (R26.89);Difficulty in walking, not elsewhere classified (R26.2);History of falling (Z91.81);Muscle weakness (generalized) (M62.81)    Time: 6073-7106 PT Time Calculation (min) (ACUTE ONLY): 27 min   Charges:   PT Evaluation $PT Eval Moderate Complexity: 1 Mod PT Treatments $Gait Training: 8-22 mins        Raymond Gurney, PT, DPT Acute Rehabilitation Services  Pager: [phone] Office: [phone]   Jewel Baize 03/18/2020, 2:53 PM

## 2020-03-18 NOTE — Discharge Instructions (Signed)
Advised to follow-up with primary care physician in 1 week. Advised to take her Depakote to 50 mg p.o. twice daily. Advised to follow-up with neurology in 3 to 4 weeks.

## 2020-03-18 NOTE — Progress Notes (Signed)
Neurology Progress Note  S: Daughter "I hope she goes home today".   O: Current vital signs: BP (!) 156/77 (BP Location: Right Arm)   Pulse 79   Temp 97.6 F (36.4 C) (Oral)   Resp 17   Ht 5\' 2"  (1.575 m)   Wt 48.5 kg Comment: from 2020 records, please update  SpO2 98%   BMI 19.57 kg/m  Vital signs in last 24 hours: Temp:  [97.6 F (36.4 C)-98.3 F (36.8 C)] 97.6 F (36.4 C) (12/02 0731) Pulse Rate:  [68-79] 79 (12/02 0731) Resp:  [15-18] 17 (12/02 0731) BP: (120-156)/(57-77) 156/77 (12/02 0731) SpO2:  [95 %-99 %] 98 % (12/02 0731) GENERAL: Awake, alert in NAD HEENT: Normocephalic and atraumatic LUNGS:normal respiratory effort NEURO:  Mental Status: AA&O to name Language: speech is fluent. No aphasia. Lacks comprehension due to dementia Cranial Nerves: No facial asymmetry. hearing intact to voice.  Motor: moves all extremities spontaneously No gross ataxia.   Medications  Current Facility-Administered Medications:  .  0.9 %  sodium chloride infusion, 75 mL/hr, Intravenous, Continuous, Mansy, Jan A, MD, Last Rate: 75 mL/hr at 03/17/20 2300, 75 mL/hr at 03/17/20 2300 .  acetaminophen (TYLENOL) tablet 650 mg, 650 mg, Oral, Q4H PRN, 650 mg at 03/16/20 2323 **OR** acetaminophen (TYLENOL) suppository 650 mg, 650 mg, Rectal, Q4H PRN, Mansy, Jan A, MD .  aspirin EC tablet 81 mg, 81 mg, Oral, Daily, Mansy, Jan A, MD, 81 mg at 03/18/20 0900 .  cholecalciferol (VITAMIN D3) tablet 1,000 Units, 1,000 Units, Oral, Daily, Mansy, 14/02/21, MD, 1,000 Units at 03/18/20 0859 .  donepezil (ARICEPT) tablet 10 mg, 10 mg, Oral, QHS, Mansy, Jan A, MD, 10 mg at 03/17/20 2214 .  enoxaparin (LOVENOX) injection 40 mg, 40 mg, Subcutaneous, Q24H, 2215, MD, 40 mg at 03/18/20 0859 .  labetalol (NORMODYNE) injection 20 mg, 20 mg, Intravenous, Q3H PRN, Mansy, Jan A, MD .  levothyroxine (SYNTHROID) tablet 25 mcg, 25 mcg, Oral, Once per day on Mon Wed Fri, Mansy, Jan A, MD, 25 mcg at 03/17/20 0715 .   levothyroxine (SYNTHROID) tablet 50 mcg, 50 mcg, Oral, Once per day on Sun Tue Thu Sat, Mansy, Jan A, MD, 50 mcg at 03/18/20 0602 .  LORazepam (ATIVAN) injection 1-2 mg, 1-2 mg, Intravenous, Q2H PRN, Mansy, Jan A, MD .  magnesium hydroxide (MILK OF MAGNESIA) suspension 30 mL, 30 mL, Oral, Daily PRN, Mansy, Jan A, MD .  mirtazapine (REMERON) tablet 7.5 mg, 7.5 mg, Oral, QHS, Mansy, Jan A, MD, 7.5 mg at 03/17/20 2214 .  multivitamin with minerals tablet 1 tablet, 1 tablet, Oral, Daily, Mansy, Jan A, MD, 1 tablet at 03/18/20 0900 .  ondansetron (ZOFRAN) tablet 4 mg, 4 mg, Oral, Q6H PRN **OR** ondansetron (ZOFRAN) injection 4 mg, 4 mg, Intravenous, Q6H PRN, Mansy, Jan A, MD .  valproic acid (DEPAKENE) 250 MG capsule 250 mg, 250 mg, Oral, BID, 14/02/21, MD, 250 mg at 03/18/20 0859 Labs CBC    Component Value Date/Time   WBC 8.6 03/18/2020 0435   RBC 3.63 (L) 03/18/2020 0435   HGB 10.9 (L) 03/18/2020 0435   HCT 33.8 (L) 03/18/2020 0435   PLT 276 03/18/2020 0435   MCV 93.1 03/18/2020 0435   MCH 30.0 03/18/2020 0435   MCHC 32.2 03/18/2020 0435   RDW 14.2 03/18/2020 0435   LYMPHSABS 3.4 03/16/2020 1820   MONOABS 0.5 03/16/2020 1820   EOSABS 0.1 03/16/2020 1820   BASOSABS 0.1 03/16/2020 1820    CMP  Component Value Date/Time   NA 140 03/18/2020 0435   K 3.9 03/18/2020 0435   CL 101 03/18/2020 0435   CO2 29 03/18/2020 0435   GLUCOSE 85 03/18/2020 0435   BUN 5 (L) 03/18/2020 0435   CREATININE 0.66 03/18/2020 0435   CALCIUM 8.8 (L) 03/18/2020 0435   PROT 6.0 (L) 03/17/2020 0306   ALBUMIN 3.1 (L) 03/17/2020 0306   AST 37 03/17/2020 0306   ALT 23 03/17/2020 0306   ALKPHOS 72 03/17/2020 0306   BILITOT 0.5 03/17/2020 0306   GFRNONAA >60 03/18/2020 0435   GFRAA >60 08/11/2018 2127    Imaging I have reviewed images in epic and the results pertinent to this consultation are: Dr. Wilford Corner reviewed pictures with daughter.   CT Head showed chronic ischemic microangiopathy and  generalized atrophy without acute intracranial abnormality. MRI Brain showed no acute abnormality. Same atrophy and ischemic microangiopathy as noted on CT head.   Assessment: 86 yo female with a PMHx of dementia who lives in a SNF, HTN, HLD, and Hypothyroidism. Ms Irion was admitted on 12/1 after being found on the ground shaking by staff at the SNF. Generalized shaking noted which lasted about 5 minutes. She had a second event and was brought to ED by EMS. After Versed wore off, pt was back to baseline mental status. No previous hx of seizures. EEG was suggestive of cortical dysfunction in the right temporal region. No seizures or definite epileptiform discharges were seen. Given the assymetry of dysfunction, an MRI was ordered which was negative. Given EEG focality, Depakote 250mg  bid was started and daughter agreed.   Impression: First time seizure activity in a pt with known dementia. No further seizures in hospital.   Recommendations: Continue Depakote 250mg  po bid. This may also help with mood/behavior in a dementia patient. As far as neuro is concerned, pt may be discharged and this was discussed with the hospitalist by Dr. .  Outpatient follow up with [facility] in 4-6 weeks.   , MSN, APN-BC/Nurse Practitioner Pt seen with Dr. Wilford Corner who will edit note and plan as needed.   Attending Neurohospitalist Addendum Patient seen and examined with APP/Resident. Agree with the history and physical as documented above. Agree with the plan as documented, which I helped formulate. I have independently reviewed the chart, obtained history, review of systems and examined the patient.I have personally reviewed pertinent head/neck/spine imaging (CT/MRI). Plan d/w Dr. Jimmye Norman, hospitalist. Please feel free to call with any questions. --- Wilford Corner, MD [facility] Pager: [phone]  If 7pm to 7am, please call on call as listed on [facility]ION.

## 2020-03-18 NOTE — Discharge Summary (Signed)
Physician Discharge Summary  Isabel Zamora YDX:[id number] DOB: 01-Feb-1930 DOA: 03/16/2020  PCP: Pcp, No  Admit date: 03/16/2020   Discharge date: 03/18/2020  Admitted From: Assisted living facility.  Disposition: Assisted living facility.   Recommendations for Outpatient Follow-up:  1. Follow up with PCP in 1-2 weeks. 2. Please obtain BMP/CBC in one week. 3. Advised to take Depakote 250 mg p.o. twice daily. 4. Advised to follow-up with neurology in 3 to 4 weeks.  Home Health: Home PT and OT Equipment/Devices: None  Discharge Condition: Stable CODE STATUS:DNR Diet recommendation: Heart Healthy  Brief Summary : This 86 years old female with known history of dementia, hypertension, hypothyroidism, dyslipidemia who presented in the emergency department with acute onset of tonic-clonic seizures at assisted living facility where she resides.  Patient was apparently found on the floor.  It is unclear if patient had head injury with her seizures. She was slightly confused in between the seizures.  Noncontrast CT head revealed chronic ischemic microangiopathy and generalized atrophy without acute intracranial abnormalities.  Hospital course: Patient was admitted for new onset seizures.  Neurology was consulted. Patient was placed on as needed Ativan. EEG was completed shows cortical dysfunction in the right temporal region,  no seizures or epileptiform discharges were seen. MRI was unremarkable. Given the asymmetry of dysfunction,  EEG focality and history of dementia patient was started on Depakote 250 mg twice daily. There were no further seizures in the hospital. Patient cleared from neurology to be discharged. PT and OT recommended home PT. Patient is discharged back to assisted living facility. Advised to follow-up with [facility] in 3 to 4 weeks.   She was managed for below problems   Discharge Diagnoses:  Active Problems:   Seizure (HCC)  1. New onset  seizures. She is admitted to  telemetry medical monitored bed. Continue seizures precautions. Continue as needed IV Ativan. Continue neuro checks every 4 hours with 24 hours. Neurology consulted recommended MRI and EEG. EEG no evidence of seizures noted -MRI  : Unremarkable -Patient had no further seizures while in hospital. -Patient cleared from neurology to be discharged on Depakote 250 mg twice daily   2. Elevated lactic acidwith lactic acidosis. -This could certainly be related to her seizures. -Lactic acid has trended down from 8.7-1.3. -She has no clear infectious etiology at this time. -She was started on IV bicarbonate drip. - dc bicarbonate drip. -BMPs will be followed.  3. Mild acute kidney injury>> resolved -The patient will be hydrated with IV normal saline and will follow her BMP.  4. Hypothyroidism.  continue Synthroid   5. Dementia. continue Aricept.  6. Essential hypertension. Continue as needed IV labetalol. Will resume home BP medications  Discharge Instructions  Discharge Instructions    Call MD for:  difficulty breathing, headache or visual disturbances   Complete by: As directed    Call MD for:  persistant dizziness or light-headedness   Complete by: As directed    Call MD for:  persistant nausea and vomiting   Complete by: As directed    Diet - low sodium heart healthy   Complete by: As directed    Diet Carb Modified   Complete by: As directed    Discharge instructions   Complete by: As directed    Advised to follow-up with primary care physician in 1 week. Advised to take her Depakote to 50 mg p.o. twice daily. Advised to follow-up with neurology in 3 to 4 weeks.   Increase activity slowly  Complete by: As directed      Allergies as of 03/18/2020      Reactions   Morphine And Related Other (See Comments)   "Allergic," per [facility]   Penicillins Other (See Comments)   "Allergic," per [facility]      Medication List    TAKE these  medications   acetaminophen 500 MG tablet Commonly known as: TYLENOL Take 1 tablet (500 mg total) by mouth every 6 (six) hours as needed for mild pain or fever. What changed: reasons to take this   Alive Womens Gummy Chew Chew 1 tablet by mouth daily.   aspirin EC 81 MG tablet Take 81 mg by mouth daily. Swallow whole.   cholecalciferol 25 MCG (1000 UNIT) tablet Commonly known as: VITAMIN D3 Take 1,000 Units by mouth daily.   donepezil 10 MG tablet Commonly known as: ARICEPT Take 10 mg by mouth at bedtime.   Ensure Take 237 mLs by mouth daily as needed (for a missed meal).   levothyroxine 50 MCG tablet Commonly known as: SYNTHROID Take 50 mcg by mouth every Tuesday, Thursday, Saturday, and Sunday.   levothyroxine 25 MCG tablet Commonly known as: SYNTHROID Take 25 mcg by mouth every Monday, Wednesday, and Friday.   mirtazapine 7.5 MG tablet Commonly known as: REMERON Take 7.5 mg by mouth at bedtime.   valproic acid 250 MG capsule Commonly known as: DEPAKENE Take 1 capsule (250 mg total) by mouth 2 (two) times daily.       Follow-up Information    [facility] Follow up in 4 week(s).   Contact information: [address]     [address] [place] [address] [phone]       [facility] .   Contact information: [address] [place] [address] [phone]              Allergies  Allergen Reactions  . Morphine And Related Other (See Comments)    "Allergic," per [facility]  . Penicillins Other (See Comments)    "Allergic," per [facility]    Consultations:  Neurology   Procedures/Studies: EEG  Result Date: 03/17/2020 Yadav, Priyanka O, MD     12 /04/2019 12:40 PM Patient Name: Isabel Zamora MRN: [id number] Epilepsy Attending: [id number] Referring Physician/Provider: Dr Charlsie Quest Date: 03/17/2020 Duration: 24.04 mins Patient history: 86 year old woman presented with first-time generalized  tonic-clonic seizure.  EEG to evaluate for seizures. Level of alertness: Awake AEDs during EEG study: None Technical aspects: This EEG study was done with scalp electrodes positioned according to the 10-20 International system of electrode placement. Electrical activity was acquired at a sampling rate of 500Hz  and reviewed with a high frequency filter of 70Hz  and a low frequency filter of 1Hz . EEG data were recorded continuously and digitally stored. Description: The posterior dominant rhythm consists of 9-10 Hz activity of moderate voltage (25-35 uV) seen predominantly in posterior head regions, symmetric and reactive to eye opening and eye closing. EEG showed continuous 3 to 6 Hz theta-delta slowing in right temporal region.  Sharp transients were also seen in right temporal region.  Hyperventilation and photic stimulation were not performed.   ABNORMALITY -Continuous slow, right temporal region IMPRESSION: This study is suggestive of cortical dysfunction in the right temporal region which could be secondary to underlying structural abnormality, postictal state.  No seizures or definite epileptiform discharges were seen throughout the recording. 98   CT Head Wo Contrast  Result Date: 03/16/2020 CLINICAL DATA:  Seizure and fall EXAM: CT HEAD WITHOUT CONTRAST CT CERVICAL SPINE WITHOUT CONTRAST TECHNIQUE: Multidetector CT imaging of the head and cervical spine was performed following the standard protocol without intravenous contrast. Multiplanar CT image reconstructions of the cervical spine were also generated. COMPARISON:  None. FINDINGS: CT HEAD FINDINGS Brain: There is no mass, hemorrhage or extra-axial collection. There is generalized atrophy without lobar predilection. There is hypoattenuation of the periventricular white matter, most commonly indicating chronic ischemic microangiopathy. Vascular: No abnormal hyperdensity of the major intracranial arteries or dural venous sinuses. No  intracranial atherosclerosis. Skull: The visualized skull base, calvarium and extracranial soft tissues are normal. Sinuses/Orbits: No fluid levels or advanced mucosal thickening of the visualized paranasal sinuses. No mastoid or middle ear effusion. The orbits are normal. CT CERVICAL SPINE FINDINGS Alignment: No static subluxation. Facets are aligned. Occipital condyles are normally positioned. Skull base and vertebrae: No acute fracture. Soft tissues and spinal canal: No prevertebral fluid or swelling. No visible canal hematoma. Disc levels: No advanced spinal canal or neural foraminal stenosis. Multilevel degenerative disc disease. Upper chest: No pneumothorax, pulmonary nodule or pleural effusion. Other: Normal visualized paraspinal cervical soft tissues. IMPRESSION: 1. Chronic ischemic microangiopathy and generalized atrophy without acute intracranial abnormality. 2. No acute fracture or static subluxation of the cervical spine. Electronically Signed   By: Deatra Robinson M.D.   On: 03/16/2020 20:20   CT Cervical Spine Wo Contrast  Result Date: 03/16/2020 CLINICAL DATA:  Seizure and fall EXAM: CT HEAD WITHOUT CONTRAST CT CERVICAL SPINE WITHOUT CONTRAST TECHNIQUE: Multidetector CT imaging of the head and cervical spine was performed following the standard protocol without intravenous contrast. Multiplanar CT image reconstructions of the cervical spine were also generated. COMPARISON:  None. FINDINGS: CT HEAD FINDINGS Brain: There is no mass, hemorrhage or extra-axial collection. There is generalized atrophy without lobar predilection. There is hypoattenuation of the periventricular white matter, most commonly indicating chronic ischemic microangiopathy. Vascular: No abnormal hyperdensity of the major intracranial arteries or dural venous sinuses. No intracranial atherosclerosis. Skull: The visualized skull base, calvarium and extracranial soft tissues are normal. Sinuses/Orbits: No fluid levels or advanced  mucosal thickening of the visualized paranasal sinuses. No mastoid or middle ear effusion. The orbits are normal. CT CERVICAL SPINE FINDINGS Alignment: No static subluxation. Facets are aligned. Occipital condyles are normally positioned. Skull base and vertebrae: No acute fracture. Soft tissues and spinal canal: No prevertebral fluid or swelling. No visible canal hematoma. Disc levels: No advanced spinal canal or neural foraminal stenosis. Multilevel degenerative disc disease. Upper chest: No pneumothorax, pulmonary nodule or pleural effusion. Other: Normal visualized paraspinal cervical soft tissues. IMPRESSION: 1. Chronic ischemic microangiopathy and generalized atrophy without acute intracranial abnormality. 2. No acute fracture or static subluxation of the cervical spine. Electronically Signed   By: Deatra Robinson M.D.   On: 03/16/2020 20:20   MR BRAIN WO CONTRAST  Result Date: 03/17/2020 CLINICAL DATA:  Seizure EXAM: MRI HEAD WITHOUT CONTRAST TECHNIQUE: Multiplanar, multiecho pulse sequences of the brain and surrounding structures were obtained without intravenous contrast. COMPARISON:  Brain MRI 01/30/2020 FINDINGS: Brain: No acute infarct, acute hemorrhage or extra-axial collection. Early confluent hyperintense T2-weighted signal of the periventricular and deep white matter. There is generalized atrophy without lobar predilection. No chronic microhemorrhage. Normal midline structures. Vascular: Major flow voids are preserved. Skull and upper cervical spine: Normal calvarium and skull base. Visualized upper cervical spine and soft tissues are normal. Sinuses/Orbits:No paranasal sinus fluid levels or advanced mucosal thickening. No mastoid or middle ear  effusion. Normal orbits. IMPRESSION: 1. No acute intracranial abnormality. 2. Generalized atrophy and findings of chronic ischemic microangiopathy. Electronically Signed   By: Deatra RobinsonKevin  Herman M.D.   On: 03/17/2020 20:12   DG Pelvis Portable  Result Date:  03/16/2020 CLINICAL DATA:  Fall.  Pelvic pain.  Initial encounter. EXAM: PORTABLE PELVIS 1-2 VIEWS COMPARISON:  None. FINDINGS: There is no evidence of pelvic fracture or diastasis. No pelvic bone lesions are seen. IMPRESSION: Negative. Electronically Signed   By: Danae OrleansJohn A Stahl M.D.   On: 03/16/2020 19:10   DG Chest Portable 1 View  Result Date: 03/16/2020 CLINICAL DATA:  Altered level of consciousness.  Fall. EXAM: PORTABLE CHEST 1 VIEW COMPARISON:  02/03/2020 FINDINGS: The heart size and mediastinal contours are within normal limits. Aortic atherosclerotic calcification noted. Both lungs are clear. No evidence of pneumothorax or pleural effusion. Severe S-shaped thoracolumbar scoliosis is again noted. IMPRESSION: No active disease. Electronically Signed   By: Danae OrleansJohn A Stahl M.D.   On: 03/16/2020 19:09    EEG, MRI brain   Subjective: Patient was seen and examined at bedside. Overnight events noted. Patient reports feeling much better.  She has no seizures while being in the hospital. Cleared from neurology.  MRI is negative EEG is not consistent with seizures. Patient was started on Depakote.  Discharge Exam: Vitals:   03/18/20 0731 03/18/20 1136  BP: (!) 156/77 135/65  Pulse: 79 74  Resp: 17 17  Temp: 97.6 F (36.4 C) 97.6 F (36.4 C)  SpO2: 98% 94%   Vitals:   03/18/20 0021 03/18/20 0345 03/18/20 0731 03/18/20 1136  BP: (!) 146/77 (!) 155/70 (!) 156/77 135/65  Pulse: 78 68 79 74  Resp: 16 16 17 17   Temp: 97.9 F (36.6 C) 97.7 F (36.5 C) 97.6 F (36.4 C) 97.6 F (36.4 C)  TempSrc: Oral Oral Oral Oral  SpO2: 95% 98% 98% 94%  Weight:      Height:        General: Pt is alert, awake, not in acute distress Cardiovascular: RRR, S1/S2 +, no rubs, no gallops Respiratory: CTA bilaterally, no wheezing, no rhonchi Abdominal: Soft, NT, ND, bowel sounds + Extremities: no edema, no cyanosis    The results of significant diagnostics from this hospitalization (including imaging,  microbiology, ancillary and laboratory) are listed below for reference.     Microbiology: Recent Results (from the past 240 hour(s))  Resp Panel by RT-PCR (Flu A&B, Covid) Nasopharyngeal Swab     Status: None   Collection Time: 03/16/20  8:18 PM   Specimen: Nasopharyngeal Swab; Nasopharyngeal(NP) swabs in vial transport medium  Result Value Ref Range Status   SARS Coronavirus 2 by RT PCR NEGATIVE NEGATIVE Final    Comment: (NOTE) SARS-CoV-2 target nucleic acids are NOT DETECTED.  The SARS-CoV-2 RNA is generally detectable in upper respiratory specimens during the acute phase of infection. The lowest concentration of SARS-CoV-2 viral copies this assay can detect is 138 copies/mL. A negative result does not preclude SARS-Cov-2 infection and should not be used as the sole basis for treatment or other patient management decisions. A negative result may occur with  improper specimen collection/handling, submission of specimen other than nasopharyngeal swab, presence of viral mutation(s) within the areas targeted by this assay, and inadequate number of viral copies(<138 copies/mL). A negative result must be combined with clinical observations, patient history, and epidemiological information. The expected result is Negative.  Fact Sheet for Patients:  BloggerCourse.comhttps://www.fda.gov/media/152166/download  Fact Sheet for Healthcare Providers:  SeriousBroker.ithttps://www.fda.gov/media/152162/download  This  test is no t yet approved or cleared by the [place] and  has been authorized for detection and/or diagnosis of SARS-CoV-2 by FDA under an Emergency Use Authorization (EUA). This EUA will remain  in effect (meaning this test can be used) for the duration of the COVID-19 declaration under Section 564(b)(1) of the Act, 21 U.S.C.section 360bbb-3(b)(1), unless the authorization is terminated  or revoked sooner.       Influenza A by PCR NEGATIVE NEGATIVE Final   Influenza B by PCR NEGATIVE NEGATIVE  Final    Comment: (NOTE) The Xpert Xpress SARS-CoV-2/FLU/RSV plus assay is intended as an aid in the diagnosis of influenza from Nasopharyngeal swab specimens and should not be used as a sole basis for treatment. Nasal washings and aspirates are unacceptable for Xpert Xpress SARS-CoV-2/FLU/RSV testing.  Fact Sheet for Patients: BloggerCourse.com  Fact Sheet for Healthcare Providers: SeriousBroker.it  This test is not yet approved or cleared by the [place] FDA and has been authorized for detection and/or diagnosis of SARS-CoV-2 by FDA under an Emergency Use Authorization (EUA). This EUA will remain in effect (meaning this test can be used) for the duration of the COVID-19 declaration under Section 564(b)(1) of the Act, 21 U.S.C. section 360bbb-3(b)(1), unless the authorization is terminated or revoked.  Performed at [facility], [address] [place], [place] [address]      Labs: BNP (last 3 results) No results for input(s): BNP in the last 8760 hours. Basic Metabolic Panel: Recent Labs  Lab 03/16/20 1820 03/16/20 1831 03/16/20 1833 03/17/20 0033 03/17/20 0306 03/18/20 0435  NA 137 137 137  --  136 140  K 3.2* 3.8 3.8  --  3.4* 3.9  CL 98 102  --   --  101 101  CO2 14*  --   --   --  21* 29  GLUCOSE 216* 208*  --   --  90 85  BUN 16 22  --   --  13 5*  CREATININE 1.21* 0.90  --   --  0.72 0.66  CALCIUM 9.2  --   --   --  9.0 8.8*  MG 2.0  --   --   --   --  1.8  PHOS  --   --   --  2.8  --  2.9   Liver Function Tests: Recent Labs  Lab 03/16/20 1820 03/17/20 0306  AST 46* 37  ALT 26 23  ALKPHOS 90 72  BILITOT 0.6 0.5  PROT 7.0 6.0*  ALBUMIN 3.4* 3.1*   No results for input(s): LIPASE, AMYLASE in the last 168 hours. No results for input(s): AMMONIA in the last 168 hours. CBC: Recent Labs  Lab 03/16/20 1820 03/16/20 1831 03/16/20 1833 03/17/20 0306 03/18/20 0435  WBC 12.2*  --   --   12.1* 8.6  NEUTROABS 7.9*  --   --   --   --   HGB 12.5 13.9 13.9 10.9* 10.9*  HCT 40.9 41.0 41.0 34.4* 33.8*  MCV 99.3  --   --  94.2 93.1  PLT 355  --   --  307 276   Cardiac Enzymes: Recent Labs  Lab 03/17/20 0306  CKTOTAL 369*   BNP: Invalid input(s): POCBNP CBG: No results for input(s): GLUCAP in the last 168 hours. D-Dimer No results for input(s): DDIMER in the last 72 hours. Hgb A1c No results for input(s): HGBA1C in the last 72 hours. Lipid Profile No results for input(s): CHOL,  HDL, LDLCALC, TRIG, CHOLHDL, LDLDIRECT in the last 72 hours. Thyroid function studies Recent Labs    03/17/20 0033  TSH 0.844   Anemia work up No results for input(s): VITAMINB12, FOLATE, FERRITIN, TIBC, IRON, RETICCTPCT in the last 72 hours. Urinalysis    Component Value Date/Time   COLORURINE YELLOW 03/16/2020 1815   APPEARANCEUR HAZY (A) 03/16/2020 1815   LABSPEC 1.016 03/16/2020 1815   PHURINE 5.0 03/16/2020 1815   GLUCOSEU NEGATIVE 03/16/2020 1815   HGBUR SMALL (A) 03/16/2020 1815   BILIRUBINUR NEGATIVE 03/16/2020 1815   KETONESUR NEGATIVE 03/16/2020 1815   PROTEINUR 100 (A) 03/16/2020 1815   NITRITE NEGATIVE 03/16/2020 1815   LEUKOCYTESUR NEGATIVE 03/16/2020 1815   Sepsis Labs Invalid input(s): PROCALCITONIN,  WBC,  LACTICIDVEN Microbiology Recent Results (from the past 240 hour(s))  Resp Panel by RT-PCR (Flu A&B, Covid) Nasopharyngeal Swab     Status: None   Collection Time: 03/16/20  8:18 PM   Specimen: Nasopharyngeal Swab; Nasopharyngeal(NP) swabs in vial transport medium  Result Value Ref Range Status   SARS Coronavirus 2 by RT PCR NEGATIVE NEGATIVE Final    Comment: (NOTE) SARS-CoV-2 target nucleic acids are NOT DETECTED.  The SARS-CoV-2 RNA is generally detectable in upper respiratory specimens during the acute phase of infection. The lowest concentration of SARS-CoV-2 viral copies this assay can detect is 138 copies/mL. A negative result does not preclude  SARS-Cov-2 infection and should not be used as the sole basis for treatment or other patient management decisions. A negative result may occur with  improper specimen collection/handling, submission of specimen other than nasopharyngeal swab, presence of viral mutation(s) within the areas targeted by this assay, and inadequate number of viral copies(<138 copies/mL). A negative result must be combined with clinical observations, patient history, and epidemiological information. The expected result is Negative.  Fact Sheet for Patients:  BloggerCourse.com  Fact Sheet for Healthcare Providers:  SeriousBroker.it  This test is no t yet approved or cleared by the [place] FDA and  has been authorized for detection and/or diagnosis of SARS-CoV-2 by FDA under an Emergency Use Authorization (EUA). This EUA will remain  in effect (meaning this test can be used) for the duration of the COVID-19 declaration under Section 564(b)(1) of the Act, 21 U.S.C.section 360bbb-3(b)(1), unless the authorization is terminated  or revoked sooner.       Influenza A by PCR NEGATIVE NEGATIVE Final   Influenza B by PCR NEGATIVE NEGATIVE Final    Comment: (NOTE) The Xpert Xpress SARS-CoV-2/FLU/RSV plus assay is intended as an aid in the diagnosis of influenza from Nasopharyngeal swab specimens and should not be used as a sole basis for treatment. Nasal washings and aspirates are unacceptable for Xpert Xpress SARS-CoV-2/FLU/RSV testing.  Fact Sheet for Patients: BloggerCourse.com  Fact Sheet for Healthcare Providers: SeriousBroker.it  This test is not yet approved or cleared by the [place] FDA and has been authorized for detection and/or diagnosis of SARS-CoV-2 by FDA under an Emergency Use Authorization (EUA). This EUA will remain in effect (meaning this test can be used) for the duration of  the COVID-19 declaration under Section 564(b)(1) of the Act, 21 U.S.C. section 360bbb-3(b)(1), unless the authorization is terminated or revoked.  Performed at [facility], [address], [place], [place] [address]      Time coordinating discharge: Over 30 minutes  SIGNED:   Cipriano Bunker, MD  [facility] 03/18/2020, 1:38 PM Pager   If 7PM-7AM, please contact night-coverage [email or web address]

## 2020-03-18 NOTE — [place] FL2 (Addendum)
[place] MEDICAID FL2 LEVEL OF CARE SCREENING TOOL     IDENTIFICATION  Patient Name: Isabel Zamora Birthdate: 07/29/1929 Sex: female Admission Date (Current Location): 03/16/2020  [facility] and [place] Number:  Producer, television/film/video and Address:  [facility] [place], [address], [place], [place] [address]      Provider Number: [id number]  Attending Physician Name and Address:  Cipriano Bunker, MD  Relative Name and Phone Number:       Current Level of Care: Hospital Recommended Level of Care: Assisted Living Facility Prior Approval Number:    Date Approved/Denied:   PASRR Number:    Discharge Plan: Other (Comment) ([facility])    Current Diagnoses: Patient Active Problem List   Diagnosis Date Noted  . Seizure (HCC) 03/16/2020  . Fall at home, initial encounter 02/03/2020  . STEMI (ST elevation myocardial infarction) (HCC) 02/03/2020  . Dementia without behavioral disturbance (HCC) 02/03/2020  . Hypothyroidism (acquired) 02/03/2020  . Elevated troponin   . Palliative care by specialist   . Goals of care, counseling/discussion   . UTI (urinary tract infection) 01/30/2020  . AKI (acute kidney injury) (HCC) 01/30/2020  . HTN (hypertension) 01/30/2020  . HLD (hyperlipidemia) 01/30/2020  . Acute metabolic encephalopathy 01/30/2020    Orientation RESPIRATION BLADDER Height & Weight     Self  Normal Incontinent Weight: 48.5 kg (from 2020 records, please update) Height:  5\' 2"  (157.5 cm)  BEHAVIORAL SYMPTOMS/MOOD NEUROLOGICAL BOWEL NUTRITION STATUS      Incontinent Regular diet  AMBULATORY STATUS COMMUNICATION OF NEEDS Skin   Extensive Assist Verbally Normal                       Personal Care Assistance Level of Assistance  Bathing, Feeding, Dressing Bathing Assistance: Maximum assistance Feeding assistance: Limited assistance Dressing Assistance: Maximum assistance     Functional Limitations Info  Sight, Hearing, Speech Sight Info:  Adequate Hearing Info: Adequate Speech Info: Adequate    SPECIAL CARE FACTORS FREQUENCY                       Contractures Contractures Info: Not present    Additional Factors Info  Code Status, Allergies, Psychotropic Code Status Info: DNR Allergies Info: Morphine/ penicillin Psychotropic Info: Aricept 10 mg at bedtime/ remeron 7.5 mg at bedtime/ depakene 250 mg BID         Current Medications (03/18/2020):  This is the current hospital active medication list Current Facility-Administered Medications  Medication Dose Route Frequency Provider Last Rate Last Admin  . 0.9 %  sodium chloride infusion  75 mL/hr Intravenous Continuous Mansy, Jan A, MD 75 mL/hr at 03/17/20 2300 75 mL/hr at 03/17/20 2300  . acetaminophen (TYLENOL) tablet 650 mg  650 mg Oral Q4H PRN Mansy, Jan A, MD   650 mg at 03/16/20 2323   Or  . acetaminophen (TYLENOL) suppository 650 mg  650 mg Rectal Q4H PRN Mansy, Jan A, MD      . aspirin EC tablet 81 mg  81 mg Oral Daily Mansy, Jan A, MD   81 mg at 03/18/20 0900  . cholecalciferol (VITAMIN D3) tablet 1,000 Units  1,000 Units Oral Daily Mansy, 14/02/21, MD   1,000 Units at 03/18/20 0859  . donepezil (ARICEPT) tablet 10 mg  10 mg Oral QHS Mansy, Jan A, MD   10 mg at 03/17/20 2214  . enoxaparin (LOVENOX) injection 40 mg  40 mg Subcutaneous Q24H 2215,  Pardeep, MD   40 mg at 03/18/20 0859  . labetalol (NORMODYNE) injection 20 mg  20 mg Intravenous Q3H PRN Mansy, Jan A, MD      . levothyroxine (SYNTHROID) tablet 25 mcg  25 mcg Oral Once per day on Mon Wed Fri Mansy, Jan A, MD   25 mcg at 03/17/20 0715  . levothyroxine (SYNTHROID) tablet 50 mcg  50 mcg Oral Once per day on Sun Tue Thu Sat Mansy, Jan A, MD   50 mcg at 03/18/20 0602  . LORazepam (ATIVAN) injection 1-2 mg  1-2 mg Intravenous Q2H PRN Mansy, Jan A, MD      . magnesium hydroxide (MILK OF MAGNESIA) suspension 30 mL  30 mL Oral Daily PRN Mansy, Jan A, MD      . mirtazapine (REMERON) tablet 7.5 mg  7.5 mg  Oral QHS Mansy, Jan A, MD   7.5 mg at 03/17/20 2214  . multivitamin with minerals tablet 1 tablet  1 tablet Oral Daily Mansy, Jan A, MD   1 tablet at 03/18/20 0900  . ondansetron (ZOFRAN) tablet 4 mg  4 mg Oral Q6H PRN Mansy, Jan A, MD       Or  . ondansetron [facility]) injection 4 mg  4 mg Intravenous Q6H PRN Mansy, Jan A, MD      . valproic acid (DEPAKENE) 250 MG capsule 250 mg  250 mg Oral BID Milon Dikes, MD   250 mg at 03/18/20 5852     Discharge Medications: TAKE these medications   acetaminophen 500 MG tablet Commonly known as: TYLENOL Take 1 tablet (500 mg total) by mouth every 6 (six) hours as needed for mild pain or fever. What changed: reasons to take this   Alive Womens Gummy Chew Chew 1 tablet by mouth daily.   aspirin EC 81 MG tablet Take 81 mg by mouth daily. Swallow whole.   cholecalciferol 25 MCG (1000 UNIT) tablet Commonly known as: VITAMIN D3 Take 1,000 Units by mouth daily.   donepezil 10 MG tablet Commonly known as: ARICEPT Take 10 mg by mouth at bedtime.   Ensure Take 237 mLs by mouth daily as needed (for a missed meal).   levothyroxine 50 MCG tablet Commonly known as: SYNTHROID Take 50 mcg by mouth every Tuesday, Thursday, Saturday, and Sunday.   levothyroxine 25 MCG tablet Commonly known as: SYNTHROID Take 25 mcg by mouth every Monday, Wednesday, and Friday.   mirtazapine 7.5 MG tablet Commonly known as: REMERON Take 7.5 mg by mouth at bedtime.   valproic acid 250 MG capsule Commonly known as: DEPAKENE Take 1 capsule (250 mg total) by mouth 2 (two) times daily.      Relevant Imaging Results:  Relevant Lab Results:   Additional Information SS#: [id number]  Kermit Balo, RN

## 2020-12-16 DEATH — deceased
